# Patient Record
Sex: Female | Born: 1995 | Race: White | Hispanic: Yes | Marital: Married | Smoking: Never smoker
Health system: Southern US, Community
[De-identification: ages and names within clinical notes are randomized; demographics above are authoritative.]

## PROBLEM LIST (undated history)

## (undated) DIAGNOSIS — Z789 Other specified health status: Secondary | ICD-10-CM

## (undated) DIAGNOSIS — O139 Gestational [pregnancy-induced] hypertension without significant proteinuria, unspecified trimester: Secondary | ICD-10-CM

## (undated) HISTORY — PX: WISDOM TOOTH EXTRACTION: SHX21

## (undated) HISTORY — PX: NO PAST SURGERIES: SHX2092

---
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
## 2017-12-08 IMAGING — US US THYROID
1 series · 13 of 25 positions shown · non-contrast
Comparison: None.

CLINICAL DATA: Other.  Submandibular lump.

EXAM:
THYROID ULTRASOUND
TECHNIQUE: Ultrasound examination of the thyroid gland and adjacent soft
tissues was performed.

[Series 1: us thyroid · 0.04mm/px · 13 of 39 slices shown]
[im 1/39]
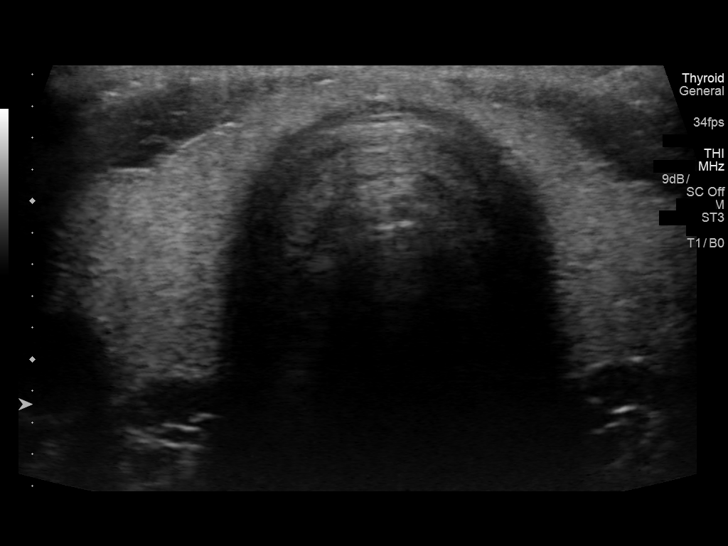
[im 4/39]
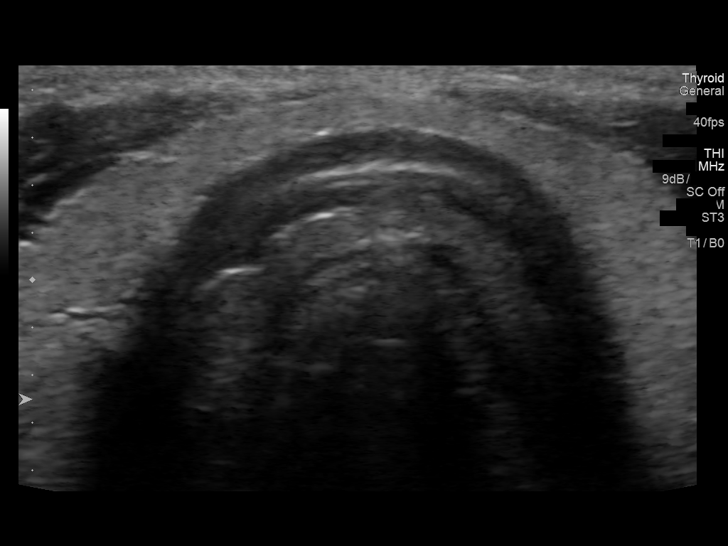
[im 7/39]
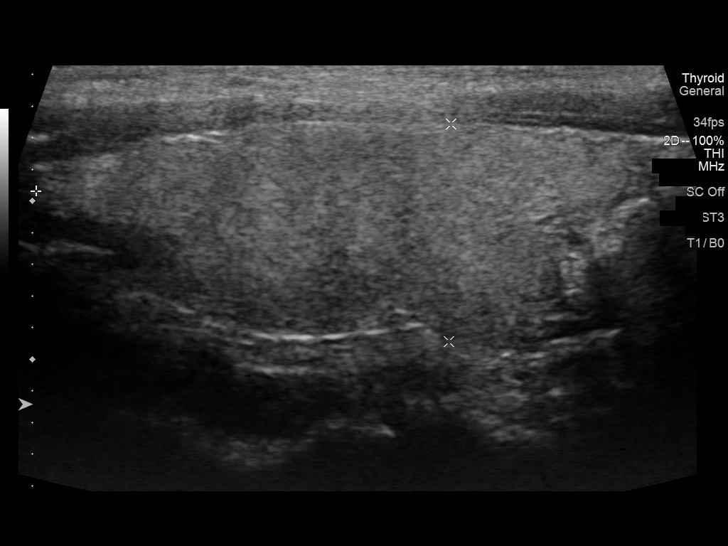
[im 10/39]
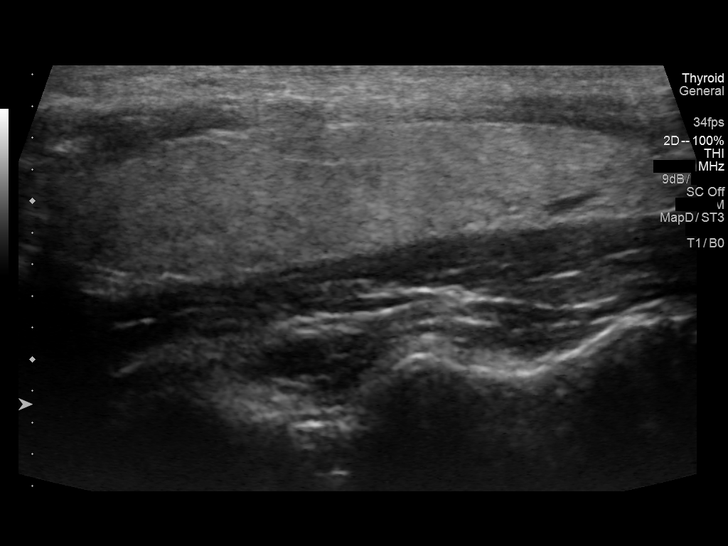
[im 13/39]
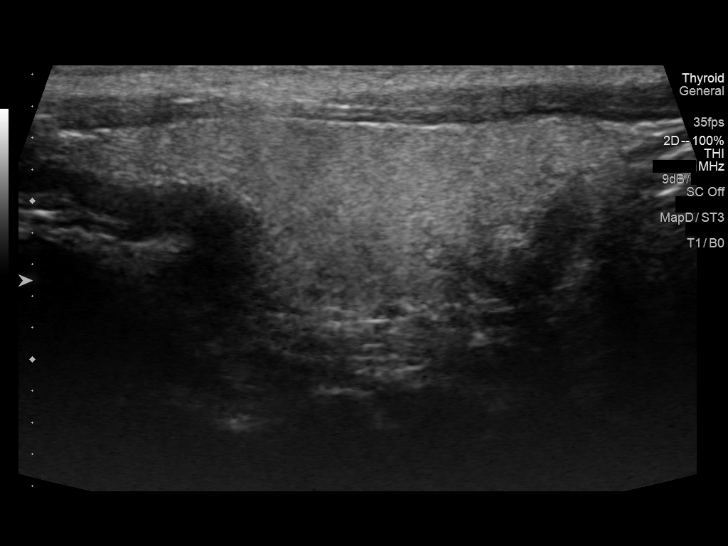
[im 16/39]
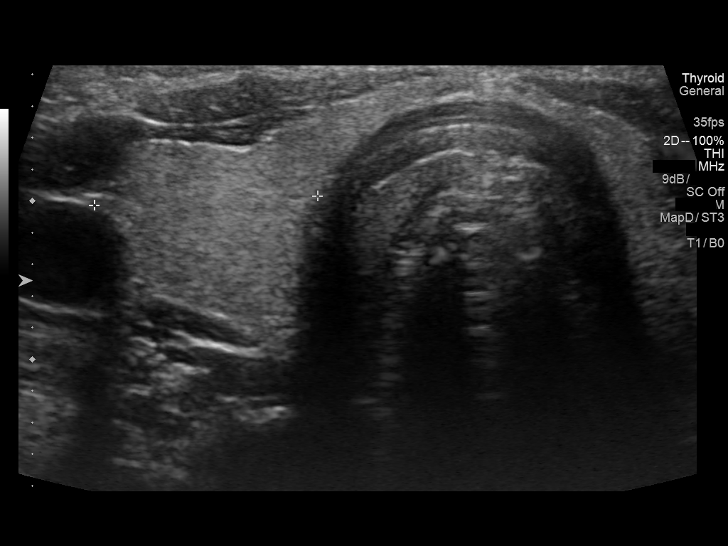
[im 20/39]
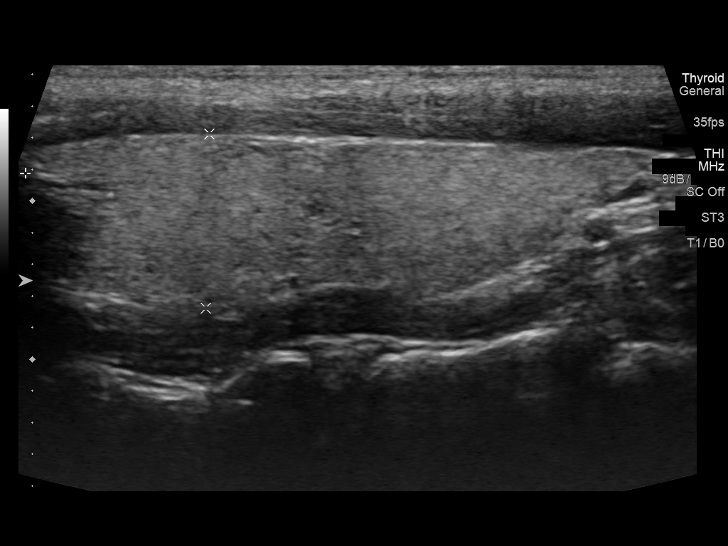
[im 23/39]
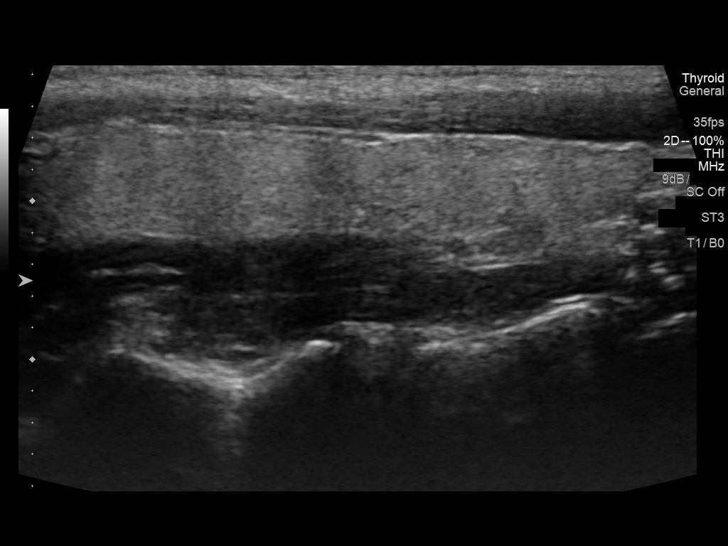
[im 26/39]
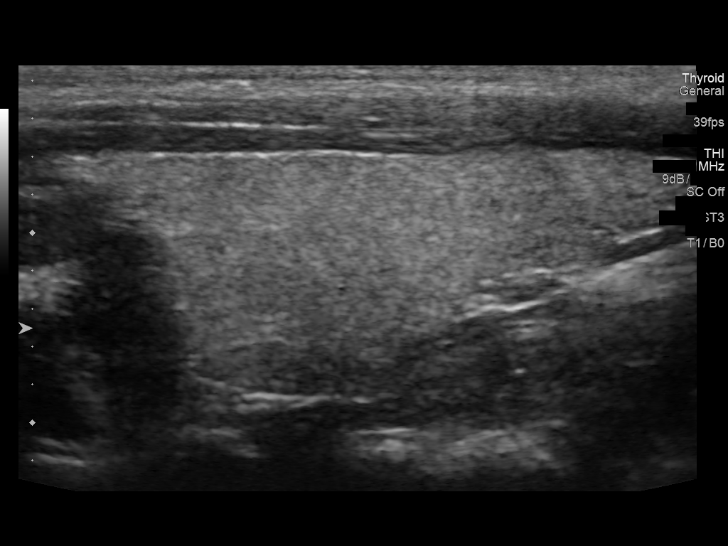
[im 29/39]
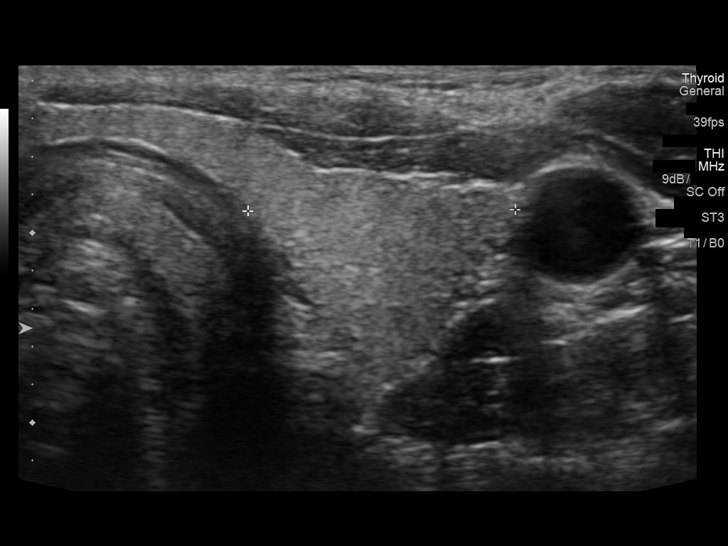
[im 32/39]
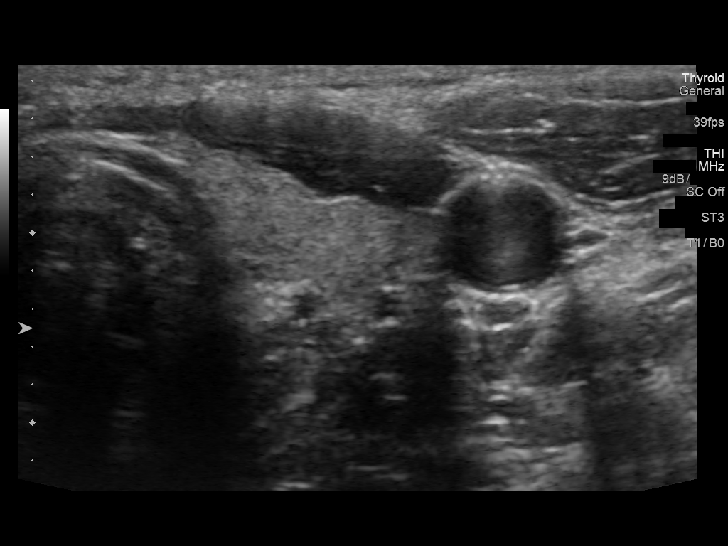
[im 35/39]
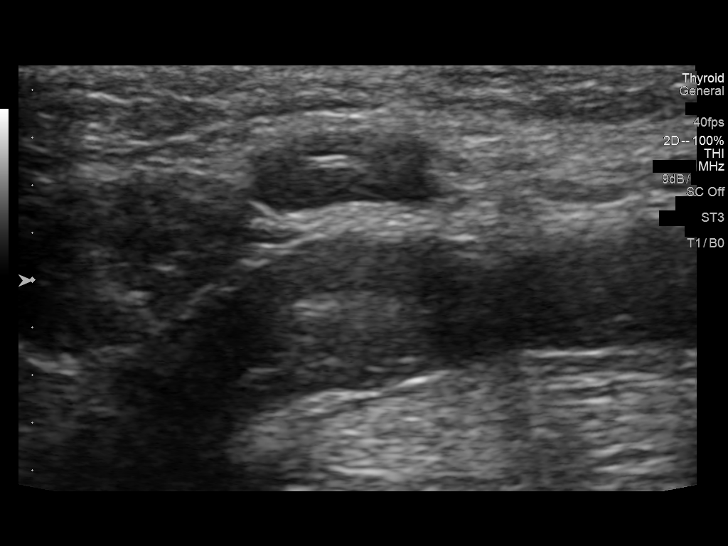
[im 39/39]
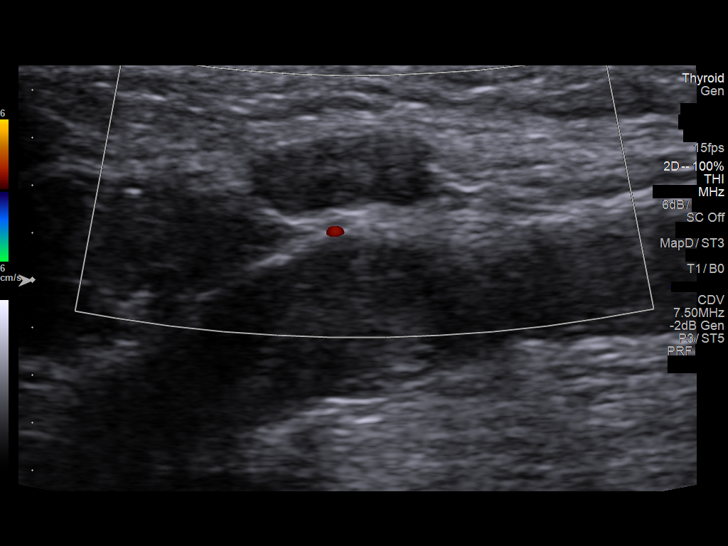

[13 of 25 positions shown; findings below may reference images not displayed]

FINDINGS: Parenchymal Echotexture: Normal

Isthmus: Normal in size measures 0.2 cm in diameter

Right lobe: Normal in size measuring 4.0 x 1.4 x 1.4 cm

Left lobe: Normal in size measuring 4.1 x 1.1 x 1.4 cm

_________________________________________________________

Estimated total number of nodules >/= 1 cm: 0

Number of spongiform nodules >/=  2 cm not described below (TR1): 0

Number of mixed cystic and solid nodules >/= 1.5 cm not described
below (TR2): 0

_________________________________________________________

No discrete nodules are seen within the thyroid gland.

There is a benign-appearing lymph node within the subcutaneous
tissues at the patient's palpable area of concern inferior to the
chin. The lymph node is not enlarged by size criteria, measuring
cm in diameter and maintains a benign fatty hilum (image 39).
IMPRESSION: 1. Normal thyroid ultrasound.
2. A solitary benign appearing non pathologically enlarged lymph
node correlates with the patient's palpable area of concern.

## 2018-03-05 ENCOUNTER — Encounter (HOSPITAL_COMMUNITY): Payer: Self-pay | Admitting: Emergency Medicine

## 2018-03-05 ENCOUNTER — Emergency Department (HOSPITAL_COMMUNITY)
Admission: EM | Admit: 2018-03-05 | Discharge: 2018-03-05 | Disposition: A | Discharge status: Home / Self Care | Payer: Self-pay | Attending: Emergency Medicine | Admitting: Emergency Medicine

## 2018-03-05 DIAGNOSIS — K529 Noninfective gastroenteritis and colitis, unspecified: Secondary | ICD-10-CM | POA: Insufficient documentation

## 2018-03-05 LAB — COMPREHENSIVE METABOLIC PANEL
ALT: 15 U/L (ref 0–44)
AST: 18 U/L (ref 15–41)
Albumin: 4.6 g/dL (ref 3.5–5.0)
Alkaline Phosphatase: 57 U/L (ref 38–126)
Anion gap: 8 (ref 5–15)
BUN: 12 mg/dL (ref 6–20)
CO2: 25 mmol/L (ref 22–32)
Calcium: 9 mg/dL (ref 8.9–10.3)
Chloride: 104 mmol/L (ref 98–111)
Creatinine, Ser: 0.58 mg/dL (ref 0.44–1.00)
GFR calc Af Amer: >60 mL/min (ref >60)
GFR calc non Af Amer: >60 mL/min (ref >60)
Glucose, Bld: 108 mg/dL — ABNORMAL HIGH (ref 70–99)
Potassium: 4 mmol/L (ref 3.5–5.1)
Sodium: 137 mmol/L (ref 135–145)
Total Bilirubin: 1.7 mg/dL — ABNORMAL HIGH (ref 0.3–1.2)
Total Protein: 7.3 g/dL (ref 6.5–8.1)

## 2018-03-05 LAB — CBC
HCT: 37.9 % (ref 36.0–46.0)
Hemoglobin: 12.8 g/dL (ref 12.0–15.0)
MCH: 29.7 pg (ref 26.0–34.0)
MCHC: 33.8 g/dL (ref 30.0–36.0)
MCV: 87.9 fL (ref 80.0–100.0)
Platelets: 211 10*3/uL (ref 150–400)
RBC: 4.31 MIL/uL (ref 3.87–5.11)
RDW: 12.3 % (ref 11.5–15.5)
WBC: 6.5 10*3/uL (ref 4.0–10.5)
nRBC: 0 % (ref 0.0–0.2)

## 2018-03-05 LAB — LIPASE, BLOOD: Lipase: 30 U/L (ref 11–51)

## 2018-03-05 LAB — I-STAT BETA HCG BLOOD, ED (MC, WL, AP ONLY): I-stat hCG, quantitative: <5 m[IU]/mL (ref <5)

## 2018-03-05 MED ORDER — ONDANSETRON 4 MG PO TBDP: 4.0000 mg | ORAL_TABLET | Freq: Three times a day (TID) | ORAL | 0 refills | Status: DC | PRN

## 2018-03-05 MED ORDER — DICYCLOMINE HCL 10 MG PO CAPS
10.0000 mg | ORAL_CAPSULE | Freq: Once | ORAL | Status: AC
  Administered 2018-03-05: 10 mg via ORAL
  Filled 2018-03-05: qty 1

## 2018-03-05 MED ORDER — ONDANSETRON 4 MG PO TBDP
4.0000 mg | ORAL_TABLET | Freq: Once | ORAL | Status: AC
  Administered 2018-03-05: 4 mg via ORAL
  Filled 2018-03-05: qty 1

## 2018-03-05 NOTE — ED Triage Notes (Signed)
Pt c/o abd pais with n/v/d that started around 4am this morning. reports other family members have been sick

## 2018-03-05 NOTE — ED Notes (Addendum)
Pt states that she has had 1 episode of emesis since arrival Pt requests meds for nausea Triage RN notified

## 2018-03-05 NOTE — ED Provider Notes (Signed)
[facility]-EMERGENCY DEPT Provider Note   CSN: [id number] Arrival date & time: 03/05/18  1758     History   Chief Complaint Chief Complaint  Patient presents with  . Emesis  . Diarrhea  . Abdominal Pain    HPI Tonya Mclean is a 23 y.o. female.  23 year old female presents to the emergency department for evaluation of vomiting and diarrhea.  Symptoms began yesterday.  She has had approximately 10 episodes of vomiting as well as 4 episodes of watery, nonbloody diarrhea.  Has had improvement in her nausea since taking Zofran prior to arrival.  Reports some sharp abdominal discomfort which is intermittent, located in her epigastrium.  Symptoms began after eating at a restaurant Sunday evening.  She presents with her 2 siblings who are experiencing similar symptoms; states her friends are also experiencing N/V/D as well.  No hx of abdominal surgeries.  The history is provided by the patient. No language interpreter was used.  Emesis  Associated symptoms: abdominal pain and diarrhea   Diarrhea  Associated symptoms: abdominal pain and vomiting   Abdominal Pain  Associated symptoms: diarrhea and vomiting     History reviewed. No pertinent past medical history.  There are no active problems to display for this patient.   History reviewed. No pertinent surgical history.   OB History   No obstetric history on file.      Home Medications    Prior to Admission medications   Medication Sig Start Date End Date Taking? Authorizing Provider  ibuprofen (ADVIL,MOTRIN) 200 MG tablet Take 400 mg by mouth daily as needed (pain).   Yes [provider]  ondansetron (ZOFRAN ODT) 4 MG disintegrating tablet Take 1 tablet (4 mg total) by mouth every 8 (eight) hours as needed for nausea or vomiting. 03/05/18   Antony MaduraHumes, Yama Nielson, PA-C    Family History No family history on file.  Social History Social History   Tobacco Use  . Smoking status: Never Smoker  .  Smokeless tobacco: Never Used  Substance Use Topics  . Alcohol use: Not on file  . Drug use: Not on file     Allergies   Patient has no known allergies.   Review of Systems Review of Systems  Gastrointestinal: Positive for abdominal pain, diarrhea and vomiting.  Ten systems reviewed and are negative for acute change, except as noted in the HPI.    Physical Exam Updated Vital Signs BP 115/60 (BP Location: Left Arm)   Pulse 98   Temp 99.3 F (37.4 C) (Oral)   Resp 14   Ht 5' 3.5" (1.613 m)   Wt 55.9 kg   LMP 02/25/2018   SpO2 98%   BMI 21.48 kg/m   Physical Exam Vitals signs and nursing note reviewed.  Constitutional:      General: She is not in acute distress.    Appearance: She is well-developed. She is not diaphoretic.     Comments: Nontoxic appearing and in NAD  HENT:     Head: Normocephalic and atraumatic.     Mouth/Throat:     Comments: Moist mm Eyes:     General: No scleral icterus.    Conjunctiva/sclera: Conjunctivae normal.  Neck:     Musculoskeletal: Normal range of motion.  Cardiovascular:     Rate and Rhythm: Normal rate and regular rhythm.     Pulses: Normal pulses.  Pulmonary:     Effort: Pulmonary effort is normal. No respiratory distress.     Breath sounds: No  wheezing, rhonchi or rales.     Comments: Respirations even and unlabored Abdominal:     General: Abdomen is flat.     Palpations: Abdomen is soft. There is no mass.     Tenderness: There is abdominal tenderness (epigastric, mild). There is no guarding.     Hernia: No hernia is present.  Musculoskeletal: Normal range of motion.  Skin:    General: Skin is warm and dry.     Coloration: Skin is not pale.     Findings: No erythema or rash.  Neurological:     Mental Status: She is alert and oriented to person, place, and time.     Coordination: Coordination normal.  Psychiatric:        Behavior: Behavior normal.      ED Treatments / Results  Labs (all labs ordered are listed,  but only abnormal results are displayed) Labs Reviewed  COMPREHENSIVE METABOLIC PANEL - Abnormal; Notable for the following components:      Result Value   Glucose, Bld 108 (*)    Total Bilirubin 1.7 (*)    All other components within normal limits  LIPASE, BLOOD  CBC  I-STAT BETA HCG BLOOD, ED ([facility], [facility], [facility] ONLY)    EKG None  Radiology No results found.  Procedures Procedures (including critical care time)  Medications Ordered in ED Medications  ondansetron (ZOFRAN-ODT) disintegrating tablet 4 mg (4 mg Oral Given 03/05/18 2134)    10:44 PM Patient tolerating PO fluids without emesis.   Initial Impression / Assessment and Plan / ED Course  I have reviewed the triage vital signs and the nursing notes.  Pertinent labs & imaging results that were available during my care of the patient were reviewed by me and considered in my medical decision making (see chart for details).     Patient with symptoms consistent with gastroenteritis.  Vitals are stable, no fever.  No clinical signs of dehydration, moist mm.  Tolerating PO fluids after Zofran given in triage.  Abdomen soft without masses or peritoneal signs.  Patient presenting with 2 siblings, both of whom are suffering from similar symptoms. Doubt emergent etiology of symptoms, likely viral vs food borne.  Will continue with outpatient management with Zofran.  Encouraged PCP follow up.  Return precautions discussed and provided. Patient discharged in stable condition with no unaddressed concerns.   Final Clinical Impressions(s) / ED Diagnoses   Final diagnoses:  Gastroenteritis    ED Discharge Orders         Ordered    ondansetron (ZOFRAN ODT) 4 MG disintegrating tablet  Every 8 hours PRN     03/05/18 2244           Antony Madura, PA-C 03/05/18 2247    Shaune Pollack, MD 03/06/18 [phone]

## 2018-03-05 NOTE — ED Notes (Signed)
Pt is alert and orinted x 4  And is verbally responsive. Pt offered fluids and tolerated well.

## 2018-03-05 NOTE — Discharge Instructions (Signed)
Avoid fried foods, fatty foods, greasy foods, and milk products until symptoms resolve. Be sure you drink plenty of clear liquids. We recommend the use of Zofran as prescribed for nausea/vomiting. Follow-up with your primary care doctor to ensure resolution of symptoms. °

## 2019-02-21 NOTE — L&D Delivery Note (Signed)
Delivery Note Patient pushed well for approximately 1 hour.  At 10:22 AM a viable female was delivered via Vaginal, Spontaneous (Presentation: Left Occiput Anterior).  APGAR: 9, 9; weight 3715 gm *8lb 3oz)  .   Placenta status: Spontaneous, Intact.  Cord: 3 vessels with the following complications: None.  Cord pH: n/a   Anesthesia: Epidural Episiotomy: None Lacerations: 2nd degree;Labial--left;Sulcus--right Suture Repair: 2.0 3.0 vicryl Est. Blood Loss (mL):   Mom to postpartum.  Baby to Couplet care / Skin to Skin.  Tonya Mclean GEFFEL Tonya Mclean 11/10/2019, 11:28 AM

## 2019-02-25 ENCOUNTER — Encounter: Payer: Self-pay | Admitting: Family Medicine

## 2019-02-25 ENCOUNTER — Other Ambulatory Visit: Payer: Self-pay

## 2019-02-25 ENCOUNTER — Ambulatory Visit: Payer: Managed Care, Other (non HMO) | Admitting: Family Medicine

## 2019-02-25 VITALS — BP 105/66 | HR 73 | Temp 98.3°F | Ht 63.5 in | Wt 139.2 lb

## 2019-02-25 DIAGNOSIS — N3090 Cystitis, unspecified without hematuria: Secondary | ICD-10-CM

## 2019-02-25 DIAGNOSIS — N390 Urinary tract infection, site not specified: Secondary | ICD-10-CM

## 2019-02-25 DIAGNOSIS — Z23 Encounter for immunization: Secondary | ICD-10-CM

## 2019-02-25 LAB — POCT URINALYSIS DIP (MANUAL ENTRY)
Bilirubin, UA: NEGATIVE
Glucose, UA: NEGATIVE mg/dL
Ketones, POC UA: NEGATIVE mg/dL
Nitrite, UA: NEGATIVE
Protein Ur, POC: NEGATIVE mg/dL
Spec Grav, UA: 1.02 (ref 1.010–1.025)
Urobilinogen, UA: 0.2 E.U./dL
pH, UA: 7 (ref 5.0–8.0)

## 2019-02-25 LAB — POC MICROSCOPIC URINALYSIS (UMFC): Mucus: ABSENT

## 2019-02-25 MED ORDER — CEPHALEXIN 500 MG PO CAPS: 500.0000 mg | ORAL_CAPSULE | Freq: Two times a day (BID) | ORAL | 0 refills | Status: DC

## 2019-02-25 NOTE — Patient Instructions (Addendum)
Drink plenty of fluids  Take cephalexin 500 mg 1 twice daily  Return if worse  You can take some over-the-counter Azo if needed for pain   Urinary Tract Infection, Adult A urinary tract infection (UTI) is an infection of any part of the urinary tract. The urinary tract includes:  The kidneys.  The ureters.  The bladder.  The urethra. These organs make, store, and get rid of pee (urine) in the body. What are the causes? This is caused by germs (bacteria) in your genital area. These germs grow and cause swelling (inflammation) of your urinary tract. What increases the risk? You are more likely to develop this condition if:  You have a small, thin tube (catheter) to drain pee.  You cannot control when you pee or poop (incontinence).  You are female, and: ? You use these methods to prevent pregnancy:  A medicine that kills sperm (spermicide).  A device that blocks sperm (diaphragm). ? You have low levels of a female hormone (estrogen). ? You are pregnant.  You have genes that add to your risk.  You are sexually active.  You take antibiotic medicines.  You have trouble peeing because of: ? A prostate that is bigger than normal, if you are female. ? A blockage in the part of your body that drains pee from the bladder (urethra). ? A kidney stone. ? A nerve condition that affects your bladder (neurogenic bladder). ? Not getting enough to drink. ? Not peeing often enough.  You have other conditions, such as: ? Diabetes. ? A weak disease-fighting system (immune system). ? Sickle cell disease. ? Gout. ? Injury of the spine. What are the signs or symptoms? Symptoms of this condition include:  Needing to pee right away (urgently).  Peeing often.  Peeing small amounts often.  Pain or burning when peeing.  Blood in the pee.  Pee that smells bad or not like normal.  Trouble peeing.  Pee that is cloudy.  Fluid coming from the vagina, if you are  female.  Pain in the belly or lower back. Other symptoms include:  Throwing up (vomiting).  No urge to eat.  Feeling mixed up (confused).  Being tired and grouchy (irritable).  A fever.  Watery poop (diarrhea). How is this treated? This condition may be treated with:  Antibiotic medicine.  Other medicines.  Drinking enough water. Follow these instructions at home:  Medicines  Take over-the-counter and prescription medicines only as told by your doctor.  If you were prescribed an antibiotic medicine, take it as told by your doctor. Do not stop taking it even if you start to feel better. General instructions  Make sure you: ? Pee until your bladder is empty. ? Do not hold pee for a long time. ? Empty your bladder after sex. ? Wipe from front to back after pooping if you are a female. Use each tissue one time when you wipe.  Drink enough fluid to keep your pee pale yellow.  Keep all follow-up visits as told by your doctor. This is important. Contact a doctor if:  You do not get better after 1-2 days.  Your symptoms go away and then come back. Get help right away if:  You have very bad back pain.  You have very bad pain in your lower belly.  You have a fever.  You are sick to your stomach (nauseous).  You are throwing up. Summary  A urinary tract infection (UTI) is an infection of any part of  the urinary tract.  This condition is caused by germs in your genital area.  There are many risk factors for a UTI. These include having a small, thin tube to drain pee and not being able to control when you pee or poop.  Treatment includes antibiotic medicines for germs.  Drink enough fluid to keep your pee pale yellow. This information is not intended to replace advice given to you by your health care provider. Make sure you discuss any questions you have with your health care provider. Document Revised: 01/24/2018 Document Reviewed: 08/16/2017 Elsevier  Patient Education  [employer].   If you have lab work done today you will be contacted with your lab results within the next 2 weeks.  If you have not heard from [place] then please contact us. The fastest way to get your results is to register for My Chart.   IF you received an x-ray today, you will receive an invoice from [facility]. Please contact [facility] at [phone] with questions or concerns regarding your invoice.   IF you received labwork today, you will receive an invoice from [place] [facility]. Please contact [facility] at [phone] with questions or concerns regarding your invoice.   Our billing staff will not be able to assist you with questions regarding bills from these companies.  You will be contacted with the lab results as soon as they are available. The fastest way to get your results is to activate your My Chart account. Instructions are located on the last page of this paperwork. If you have not heard from [place] regarding the results in 2 weeks, please contact this office.

## 2019-02-25 NOTE — Progress Notes (Signed)
Patient ID: ESTEE YOHE, female    DOB: [birth year]  Age: 24 y.o. MRN: [id number]  Chief Complaint  Patient presents with  . Urinary Tract Infection    buring while urinateing. started on 02/22/2119. no discharge, but their is an odor per pt.    Subjective:  Patient has been having dysuria for about 3 days.  She had had intercourse Saturday before started having the burning.  This is the first urinary tract infection she has had.  She works as a Lawyer.  Is sexually involved and not using protection they are happy if they become pregnant.  Current allergies, medications, problem list, past/family and social histories reviewed.  Objective:  BP 105/66   Pulse 73   Temp 98.3 F (36.8 C) (Temporal)   Ht 5' 3.5" (1.613 m)   Wt 139 lb 3.2 oz (63.1 kg)   SpO2 96%   BMI 24.27 kg/m   No CVA tenderness.  Abdomen soft without mass or tenderness.  Urinalysis shows trace leukocytes on the dip.  However symptoms are very clear.  Assessment & Plan:   Assessment: 1. Urinary tract infection without hematuria, site unspecified   2. Need for prophylactic vaccination with combined diphtheria-tetanus-pertussis (DTP) vaccine       Plan: Treat for cystitis.  Orders Placed This Encounter  Procedures  . TDAP VACCINE  . POCT urinalysis dipstick    No orders of the defined types were placed in this encounter.        Patient Instructions       If you have lab work done today you will be contacted with your lab results within the next 2 weeks.  If you have not heard from [place] then please contact us. The fastest way to get your results is to register for My Chart.   IF you received an x-ray today, you will receive an invoice from [facility]. Please contact [facility] at [phone] with questions or concerns regarding your invoice.   IF you received labwork today, you will receive an invoice from [place]. Please contact [facility] at [phone] with questions or  concerns regarding your invoice.   Our billing staff will not be able to assist you with questions regarding bills from these companies.  You will be contacted with the lab results as soon as they are available. The fastest way to get your results is to activate your My Chart account. Instructions are located on the last page of this paperwork. If you have not heard from [place] regarding the results in 2 weeks, please contact this office.        No follow-ups on file.   Janace Hoard, MD 02/25/2019

## 2019-02-27 LAB — URINE CULTURE

## 2019-02-27 NOTE — Progress Notes (Signed)
Urine grew E. coli that is sensitive to everything except ampicillin.  Should respond to the cephalexin.

## 2019-04-28 LAB — OB RESULTS CONSOLE RPR: RPR: NONREACTIVE

## 2019-04-28 LAB — OB RESULTS CONSOLE HIV ANTIBODY (ROUTINE TESTING): HIV: NONREACTIVE

## 2019-04-28 LAB — OB RESULTS CONSOLE RUBELLA ANTIBODY, IGM: Rubella: IMMUNE

## 2019-04-28 LAB — OB RESULTS CONSOLE HEPATITIS B SURFACE ANTIGEN: Hepatitis B Surface Ag: NEGATIVE

## 2019-10-23 LAB — OB RESULTS CONSOLE GC/CHLAMYDIA
Chlamydia: NEGATIVE
Gonorrhea: NEGATIVE

## 2019-10-23 LAB — OB RESULTS CONSOLE GBS: GBS: POSITIVE

## 2019-11-09 ENCOUNTER — Inpatient Hospital Stay (HOSPITAL_COMMUNITY)
Admission: AD | Admit: 2019-11-09 | Discharge: 2019-11-12 | DRG: 807 | Disposition: A | Discharge status: Home / Self Care | Payer: Managed Care, Other (non HMO) | Attending: Obstetrics | Admitting: Obstetrics

## 2019-11-09 ENCOUNTER — Other Ambulatory Visit: Payer: Self-pay

## 2019-11-09 DIAGNOSIS — O99214 Obesity complicating childbirth: Secondary | ICD-10-CM | POA: Diagnosis present

## 2019-11-09 DIAGNOSIS — O99824 Streptococcus B carrier state complicating childbirth: Secondary | ICD-10-CM | POA: Diagnosis present

## 2019-11-09 DIAGNOSIS — O1404 Mild to moderate pre-eclampsia, complicating childbirth: Principal | ICD-10-CM | POA: Diagnosis present

## 2019-11-09 DIAGNOSIS — E669 Obesity, unspecified: Secondary | ICD-10-CM | POA: Diagnosis present

## 2019-11-09 DIAGNOSIS — Z20822 Contact with and (suspected) exposure to covid-19: Secondary | ICD-10-CM | POA: Diagnosis present

## 2019-11-09 DIAGNOSIS — Z3A39 39 weeks gestation of pregnancy: Secondary | ICD-10-CM

## 2019-11-09 DIAGNOSIS — O1490 Unspecified pre-eclampsia, unspecified trimester: Secondary | ICD-10-CM | POA: Diagnosis present

## 2019-11-09 HISTORY — DX: Other specified health status: Z78.9

## 2019-11-10 ENCOUNTER — Inpatient Hospital Stay (HOSPITAL_COMMUNITY): Payer: Managed Care, Other (non HMO) | Admitting: Anesthesiology

## 2019-11-10 ENCOUNTER — Encounter (HOSPITAL_COMMUNITY): Payer: Self-pay

## 2019-11-10 DIAGNOSIS — O26893 Other specified pregnancy related conditions, third trimester: Secondary | ICD-10-CM | POA: Diagnosis present

## 2019-11-10 DIAGNOSIS — Z20822 Contact with and (suspected) exposure to covid-19: Secondary | ICD-10-CM | POA: Diagnosis present

## 2019-11-10 DIAGNOSIS — Z3A39 39 weeks gestation of pregnancy: Secondary | ICD-10-CM | POA: Diagnosis not present

## 2019-11-10 DIAGNOSIS — O99214 Obesity complicating childbirth: Secondary | ICD-10-CM | POA: Diagnosis present

## 2019-11-10 DIAGNOSIS — O1490 Unspecified pre-eclampsia, unspecified trimester: Secondary | ICD-10-CM | POA: Diagnosis present

## 2019-11-10 DIAGNOSIS — O1404 Mild to moderate pre-eclampsia, complicating childbirth: Secondary | ICD-10-CM | POA: Diagnosis present

## 2019-11-10 DIAGNOSIS — O99824 Streptococcus B carrier state complicating childbirth: Secondary | ICD-10-CM | POA: Diagnosis present

## 2019-11-10 DIAGNOSIS — E669 Obesity, unspecified: Secondary | ICD-10-CM | POA: Diagnosis present

## 2019-11-10 LAB — CBC
HCT: 37.9 % (ref 36.0–46.0)
HCT: 35.1 % — ABNORMAL LOW (ref 36.0–46.0)
Hemoglobin: 12.8 g/dL (ref 12.0–15.0)
Hemoglobin: 11.9 g/dL — ABNORMAL LOW (ref 12.0–15.0)
MCH: 29.8 pg (ref 26.0–34.0)
MCH: 30 pg (ref 26.0–34.0)
MCHC: 33.8 g/dL (ref 30.0–36.0)
MCHC: 33.9 g/dL (ref 30.0–36.0)
MCV: 88.3 fL (ref 80.0–100.0)
MCV: 88.4 fL (ref 80.0–100.0)
Platelets: 256 10*3/uL (ref 150–400)
Platelets: 225 10*3/uL (ref 150–400)
RBC: 4.29 MIL/uL (ref 3.87–5.11)
RBC: 3.97 MIL/uL (ref 3.87–5.11)
RDW: 13.1 % (ref 11.5–15.5)
RDW: 13.1 % (ref 11.5–15.5)
WBC: 11.7 10*3/uL — ABNORMAL HIGH (ref 4.0–10.5)
WBC: 15.3 10*3/uL — ABNORMAL HIGH (ref 4.0–10.5)
nRBC: 0 % (ref 0.0–0.2)
nRBC: 0 % (ref 0.0–0.2)

## 2019-11-10 LAB — COMPREHENSIVE METABOLIC PANEL
ALT: 15 U/L (ref 0–44)
AST: 20 U/L (ref 15–41)
Albumin: 3.1 g/dL — ABNORMAL LOW (ref 3.5–5.0)
Alkaline Phosphatase: 236 U/L — ABNORMAL HIGH (ref 38–126)
Anion gap: 13 (ref 5–15)
BUN: 8 mg/dL (ref 6–20)
CO2: 17 mmol/L — ABNORMAL LOW (ref 22–32)
Calcium: 9.9 mg/dL (ref 8.9–10.3)
Chloride: 105 mmol/L (ref 98–111)
Creatinine, Ser: 0.55 mg/dL (ref 0.44–1.00)
GFR calc Af Amer: >60 mL/min (ref >60)
GFR calc non Af Amer: >60 mL/min (ref >60)
Glucose, Bld: 109 mg/dL — ABNORMAL HIGH (ref 70–99)
Potassium: 3.8 mmol/L (ref 3.5–5.1)
Sodium: 135 mmol/L (ref 135–145)
Total Bilirubin: 0.3 mg/dL (ref 0.3–1.2)
Total Protein: 6.5 g/dL (ref 6.5–8.1)

## 2019-11-10 LAB — TYPE AND SCREEN
ABO/RH(D): O POS
Antibody Screen: NEGATIVE

## 2019-11-10 LAB — SARS CORONAVIRUS 2 BY RT PCR (HOSPITAL ORDER, PERFORMED IN ~~LOC~~ HOSPITAL LAB): SARS Coronavirus 2: NEGATIVE

## 2019-11-10 LAB — PROTEIN / CREATININE RATIO, URINE
Creatinine, Urine: 85.78 mg/dL
Protein Creatinine Ratio: 0.69 mg/mg{Cre} — ABNORMAL HIGH (ref 0.00–0.15)
Total Protein, Urine: 59 mg/dL

## 2019-11-10 LAB — RPR: RPR Ser Ql: NONREACTIVE

## 2019-11-10 MED ORDER — OXYCODONE-ACETAMINOPHEN 5-325 MG PO TABS: 1.0000 | ORAL_TABLET | ORAL | Status: DC | PRN

## 2019-11-10 MED ORDER — ACETAMINOPHEN 325 MG PO TABS: 650.0000 mg | ORAL_TABLET | ORAL | Status: DC | PRN

## 2019-11-10 MED ORDER — OXYTOCIN BOLUS FROM INFUSION
333.0000 mL | Freq: Once | INTRAVENOUS | Status: AC
  Administered 2019-11-10: 333 mL via INTRAVENOUS

## 2019-11-10 MED ORDER — COCONUT OIL OIL: 1.0000 "application " | TOPICAL_OIL | Status: DC | PRN

## 2019-11-10 MED ORDER — SIMETHICONE 80 MG PO CHEW: 80.0000 mg | CHEWABLE_TABLET | ORAL | Status: DC | PRN

## 2019-11-10 MED ORDER — LACTATED RINGERS IV SOLN: 500.0000 mL | INTRAVENOUS | Status: DC | PRN

## 2019-11-10 MED ORDER — BENZOCAINE-MENTHOL 20-0.5 % EX AERO
1.0000 "application " | INHALATION_SPRAY | CUTANEOUS | Status: DC | PRN
  Administered 2019-11-10: 1 via TOPICAL
  Filled 2019-11-10: qty 56

## 2019-11-10 MED ORDER — SENNOSIDES-DOCUSATE SODIUM 8.6-50 MG PO TABS
2.0000 | ORAL_TABLET | ORAL | Status: DC
  Administered 2019-11-10 – 2019-11-11 (×2): 2 via ORAL
  Filled 2019-11-10 (×2): qty 2

## 2019-11-10 MED ORDER — OXYTOCIN-SODIUM CHLORIDE 30-0.9 UT/500ML-% IV SOLN
2.5000 [IU]/h | INTRAVENOUS | Status: DC
  Filled 2019-11-10: qty 500

## 2019-11-10 MED ORDER — LACTATED RINGERS IV SOLN: INTRAVENOUS | Status: DC

## 2019-11-10 MED ORDER — DIPHENHYDRAMINE HCL 25 MG PO CAPS: 25.0000 mg | ORAL_CAPSULE | Freq: Four times a day (QID) | ORAL | Status: DC | PRN

## 2019-11-10 MED ORDER — SODIUM CHLORIDE (PF) 0.9 % IJ SOLN
INTRAMUSCULAR | Status: DC | PRN
Start: 2019-11-10 — End: 2019-11-10
  Administered 2019-11-10: 12 mL/h via EPIDURAL

## 2019-11-10 MED ORDER — TERBUTALINE SULFATE 1 MG/ML IJ SOLN: 0.2500 mg | Freq: Once | INTRAMUSCULAR | Status: DC | PRN

## 2019-11-10 MED ORDER — TETANUS-DIPHTH-ACELL PERTUSSIS 5-2.5-18.5 LF-MCG/0.5 IM SUSP: 0.5000 mL | Freq: Once | INTRAMUSCULAR | Status: DC

## 2019-11-10 MED ORDER — PENICILLIN G POT IN DEXTROSE 60000 UNIT/ML IV SOLN
3.0000 10*6.[IU] | INTRAVENOUS | Status: DC
  Administered 2019-11-10: 3 10*6.[IU] via INTRAVENOUS
  Filled 2019-11-10: qty 50

## 2019-11-10 MED ORDER — ONDANSETRON HCL 4 MG/2ML IJ SOLN: 4.0000 mg | Freq: Four times a day (QID) | INTRAMUSCULAR | Status: DC | PRN

## 2019-11-10 MED ORDER — DIPHENHYDRAMINE HCL 50 MG/ML IJ SOLN: 12.5000 mg | INTRAMUSCULAR | Status: DC | PRN

## 2019-11-10 MED ORDER — OXYCODONE-ACETAMINOPHEN 5-325 MG PO TABS: 2.0000 | ORAL_TABLET | ORAL | Status: DC | PRN

## 2019-11-10 MED ORDER — LIDOCAINE HCL (PF) 1 % IJ SOLN: 30.0000 mL | INTRAMUSCULAR | Status: DC | PRN

## 2019-11-10 MED ORDER — MISOPROSTOL 100 MCG PO TABS: 25.0000 ug | ORAL_TABLET | ORAL | Status: DC

## 2019-11-10 MED ORDER — LIDOCAINE HCL (PF) 1 % IJ SOLN
INTRAMUSCULAR | Status: DC | PRN
  Administered 2019-11-10 (×2): 4 mL via EPIDURAL

## 2019-11-10 MED ORDER — WITCH HAZEL-GLYCERIN EX PADS: 1.0000 "application " | MEDICATED_PAD | CUTANEOUS | Status: DC | PRN

## 2019-11-10 MED ORDER — PENICILLIN G POT IN DEXTROSE 60000 UNIT/ML IV SOLN: 3.0000 10*6.[IU] | INTRAVENOUS | Status: DC

## 2019-11-10 MED ORDER — ONDANSETRON HCL 4 MG PO TABS: 4.0000 mg | ORAL_TABLET | ORAL | Status: DC | PRN

## 2019-11-10 MED ORDER — EPHEDRINE 5 MG/ML INJ: 10.0000 mg | INTRAVENOUS | Status: DC | PRN

## 2019-11-10 MED ORDER — SODIUM CHLORIDE 0.9 % IV SOLN
5.0000 10*6.[IU] | Freq: Once | INTRAVENOUS | Status: AC
  Administered 2019-11-10: 5 10*6.[IU] via INTRAVENOUS
  Filled 2019-11-10: qty 5

## 2019-11-10 MED ORDER — PRENATAL MULTIVITAMIN CH
1.0000 | ORAL_TABLET | Freq: Every day | ORAL | Status: DC
  Administered 2019-11-10 – 2019-11-12 (×3): 1 via ORAL
  Filled 2019-11-10 (×2): qty 1

## 2019-11-10 MED ORDER — FENTANYL-BUPIVACAINE-NACL 0.5-0.125-0.9 MG/250ML-% EP SOLN
12.0000 mL/h | EPIDURAL | Status: DC | PRN
  Filled 2019-11-10: qty 250

## 2019-11-10 MED ORDER — PHENYLEPHRINE 40 MCG/ML (10ML) SYRINGE FOR IV PUSH (FOR BLOOD PRESSURE SUPPORT): 80.0000 ug | PREFILLED_SYRINGE | INTRAVENOUS | Status: DC | PRN

## 2019-11-10 MED ORDER — DIBUCAINE (PERIANAL) 1 % EX OINT: 1.0000 "application " | TOPICAL_OINTMENT | CUTANEOUS | Status: DC | PRN

## 2019-11-10 MED ORDER — ONDANSETRON HCL 4 MG/2ML IJ SOLN: 4.0000 mg | INTRAMUSCULAR | Status: DC | PRN

## 2019-11-10 MED ORDER — IBUPROFEN 600 MG PO TABS
600.0000 mg | ORAL_TABLET | Freq: Four times a day (QID) | ORAL | Status: DC
  Administered 2019-11-10 – 2019-11-12 (×9): 600 mg via ORAL
  Filled 2019-11-10 (×8): qty 1

## 2019-11-10 MED ORDER — SOD CITRATE-CITRIC ACID 500-334 MG/5ML PO SOLN: 30.0000 mL | ORAL | Status: DC | PRN

## 2019-11-10 MED ORDER — OXYCODONE HCL 5 MG PO TABS: 5.0000 mg | ORAL_TABLET | ORAL | Status: DC | PRN

## 2019-11-10 MED ORDER — FENTANYL CITRATE (PF) 100 MCG/2ML IJ SOLN
100.0000 ug | Freq: Once | INTRAMUSCULAR | Status: AC
  Administered 2019-11-10: 100 ug via INTRAVENOUS
  Filled 2019-11-10: qty 2

## 2019-11-10 MED ORDER — LACTATED RINGERS IV SOLN: 500.0000 mL | Freq: Once | INTRAVENOUS | Status: DC

## 2019-11-10 MED ORDER — OXYCODONE HCL 5 MG PO TABS: 10.0000 mg | ORAL_TABLET | ORAL | Status: DC | PRN

## 2019-11-10 NOTE — Lactation Note (Signed)
This note was copied from a baby's chart. Lactation Consultation Note  Patient Name: Tonya Mclean LSLHT'D Date: 11/10/2019   Baby Tonya Genesis now 59 hours old. Mom reports she recently tried to breastfeed her for the second time and she would not latch so they gave her formula. Mom reports that she threw it up.  She is STS with mom.  Not cuing.  Asked if we could attempt to latch.  Mom agreed .  Went to move her to latch her and she was spitty so did not attempt. She had close to 15 ml of formula.  Urged parents to give less formula next time if they chose to give it again.   Left STS.    Mom reports no breastfeeding education.  Mom reports her breasts got bigger in pregnancy, no soreness or color changes that she noticed.  Mom reports she would like to breastfeed a year.  Mom reports she will go back to work and has a DEBP for home use.  Urged mom to feed her on cue and 8-12 times day.    Reviewed Understanding Mother and Baby.  Reviewed and gave [facility] breastfeeding resource handout.  Urged STS. Urged to call lactation as need.     Maternal Data    Feeding Feeding Type: Breast Fed  LATCH Score Latch: Too sleepy or reluctant, no latch achieved, no sucking elicited.  Audible Swallowing: None  Type of Nipple: Everted at rest and after stimulation  Comfort (Breast/Nipple): Soft / non-tender  Hold (Positioning): Assistance needed to correctly position infant at breast and maintain latch.  LATCH Score: 5  Interventions Interventions: Breast feeding basics reviewed;Assisted with latch;Skin to skin;Breast massage;Hand express;Support pillows;Position options  Lactation Tools Discussed/Used WIC Program: No   Consult Status      Neomia Dear 11/10/2019, 8:33 PM

## 2019-11-10 NOTE — H&P (Signed)
24 y.o. G1P0 @ [redacted]w[redacted]d presents with contractions.  In MAU, she was found to be in early labor.  She was also noted to have new onset preeclamsia without severe features.  Here labs were normal, with the exception of an elevated up:c at 0.69.  She was admitted to L&D.  She has progressed to 7cm without augmentation.  Otherwise has good fetal movement and no bleeding.  Her pregnancy has otherwise been uncomplicated.  Past Medical History:  Diagnosis Date  . Medical history non-contributory     Past Surgical History:  Procedure Laterality Date  . NO PAST SURGERIES      OB History  Gravida Para Term Preterm AB Living  1            SAB TAB Ectopic Multiple Live Births               # Outcome Date GA Lbr Len/2nd Weight Sex Delivery Anes PTL Lv  1 Current             Social History   Socioeconomic History  . Marital status: Single    Spouse name: Not on file  . Number of children: Not on file  . Years of education: Not on file  . Highest education level: Not on file  Occupational History  . Not on file  Tobacco Use  . Smoking status: Never Smoker  . Smokeless tobacco: Never Used  Vaping Use  . Vaping Use: Never used  Substance and Sexual Activity  . Alcohol use: Not Currently  . Drug use: Never  . Sexual activity: Yes    Birth control/protection: None  Other Topics Concern  . Not on file  Social History Narrative  . Not on file      Patient has no known allergies.    Prenatal Transfer Tool  Maternal Diabetes: No Genetic Screening: Normal Maternal Ultrasounds/Referrals: Normal Fetal Ultrasounds or other Referrals:  None Maternal Substance Abuse:  No Significant Maternal Medications:  Meds include: Other:  Pepcid Significant Maternal Lab Results: Group B Strep positive  ABO, Rh: --/--/O POS (09/20 7741) Antibody: NEG (09/20 0337) Rubella:  Immune RPR:   NR HBsAg:   Negative HIV:   Negative GBS:   Positive      Vitals:   11/10/19 0700 11/10/19 0720  BP:   132/67  Pulse: (!) 102 (!) 101  Resp:  18  Temp:    SpO2:       General:  NAD Abdomen:  soft, gravid, EFW 7# Ex:  no edema SVE:  7/90/-2 FHTs:  130s, moderate variability, + accelerations, category 1 Toco:  q2-4 minutes   A/P   24 y.o. G1P0 [redacted]w[redacted]d presents with labor Progressing well without augmentation Preeclampsia--BPs mild range, asymptomatic.  Will monitor closely  FSR/ vtx/ GBS negative  Delfina Schreurs GEFFEL Imanie Darrow

## 2019-11-10 NOTE — Anesthesia Postprocedure Evaluation (Signed)
Anesthesia Post Note  Patient: LAIANA FRATUS  Procedure(s) Performed: AN AD HOC LABOR EPIDURAL     Patient location during evaluation: Mother Baby Anesthesia Type: Epidural Level of consciousness: awake and alert and oriented Pain management: satisfactory to patient Vital Signs Assessment: post-procedure vital signs reviewed and stable Respiratory status: respiratory function stable Cardiovascular status: stable Postop Assessment: no headache, no backache, epidural receding, patient able to bend at knees, no signs of nausea or vomiting and adequate PO intake Anesthetic complications: no   No complications documented.  Last Vitals:  Vitals:   11/10/19 1240 11/10/19 1340  BP: (!) 144/72 139/75  Pulse: 97 99  Resp: 18 17  Temp: 36.8 C 36.7 C  SpO2: 100% 97%    Last Pain:  Vitals:   11/10/19 1340  TempSrc: Oral  PainSc: 0-No pain   Pain Goal: Patients Stated Pain Goal: 0 (11/10/19 0010)                 Nikash Mortensen

## 2019-11-10 NOTE — Anesthesia Preprocedure Evaluation (Signed)
Anesthesia Evaluation  Patient identified by MRN, date of birth, ID band Patient awake    Reviewed: Allergy & Precautions, Patient's Chart, lab work & pertinent test results  Airway Mallampati: II  TM Distance: >3 FB Neck ROM: Full    Dental no notable dental hx. (+) Teeth Intact   Pulmonary neg pulmonary ROS,    Pulmonary exam normal        Cardiovascular hypertension, Normal cardiovascular exam Rhythm:Regular Rate:Normal     Neuro/Psych negative neurological ROS  negative psych ROS   GI/Hepatic Neg liver ROS, GERD  ,  Endo/Other  Obesity  Renal/GU negative Renal ROS  negative genitourinary   Musculoskeletal negative musculoskeletal ROS (+)   Abdominal (+) + obese,   Peds  Hematology negative hematology ROS (+)   Anesthesia Other Findings   Reproductive/Obstetrics (+) Pregnancy                             Anesthesia Physical Anesthesia Plan  ASA: II  Anesthesia Plan: Epidural   Post-op Pain Management:    Induction:   PONV Risk Score and Plan:   Airway Management Planned: Natural Airway  Additional Equipment:   Intra-op Plan:   Post-operative Plan:   Informed Consent: I have reviewed the patients History and Physical, chart, labs and discussed the procedure including the risks, benefits and alternatives for the proposed anesthesia with the patient or authorized representative who has indicated his/her understanding and acceptance.       Plan Discussed with: Anesthesiologist  Anesthesia Plan Comments:         Anesthesia Quick Evaluation

## 2019-11-10 NOTE — MAU Note (Signed)
Pt reports to MAU complaining of CTX every 4-6. Denies vaginal bleeding or LOF. +FM.  GBS+

## 2019-11-10 NOTE — MAU Provider Note (Signed)
History     CSN: [id number]  Arrival date and time: 11/09/19 2350   None     Chief Complaint  Patient presents with   Contractions   HPI Patient Tonya Mclean is a 24 y.o. G1P0  at [redacted]w[redacted]d here with complaints of contractions. She denies LOF, VB, decreased fetal movements, HA, floating spots, blurry vision, RUQ pain. She reports no complications with blood sugar or blood pressure in pregnancy.  She was here for a routine labor check but found to have elevated BPs while in MAU so pre-e labs were drawn.   OB History     Gravida  1   Para      Term      Preterm      AB      Living         SAB      TAB      Ectopic      Multiple      Live Births              Past Medical History:  Diagnosis Date   Medical history non-contributory     Past Surgical History:  Procedure Laterality Date   NO PAST SURGERIES      History reviewed. No pertinent family history.  Social History   Tobacco Use   Smoking status: Never Smoker   Smokeless tobacco: Never Used  Vaping Use   Vaping Use: Never used  Substance Use Topics   Alcohol use: Not Currently   Drug use: Never    Allergies: No Known Allergies  Medications Prior to Admission  Medication Sig Dispense Refill Last Dose   Prenatal Vit-Fe Fumarate-FA (MULTIVITAMIN-PRENATAL) 27-0.8 MG TABS tablet Take 1 tablet by mouth daily at 12 noon.   11/09/2019 at Unknown time   cephALEXin (KEFLEX) 500 MG capsule Take 1 capsule (500 mg total) by mouth 2 (two) times daily. 6 capsule 0    ibuprofen (ADVIL,MOTRIN) 200 MG tablet Take 400 mg by mouth daily as needed (pain).      ondansetron (ZOFRAN ODT) 4 MG disintegrating tablet Take 1 tablet (4 mg total) by mouth every 8 (eight) hours as needed for nausea or vomiting. (Patient not taking: Reported on 02/25/2019) 10 tablet 0     Review of Systems  Constitutional: Negative.   HENT: Negative.   Respiratory: Negative.   Cardiovascular: Negative.   Gastrointestinal: Positive  for abdominal pain.  Musculoskeletal: Negative.   Neurological: Negative.   Hematological: Negative.   Psychiatric/Behavioral: Negative.    Physical Exam   Blood pressure 140/79, pulse 89, temperature 97.8 F (36.6 C), temperature source Oral, resp. rate 17, height 5' 3.5" (1.613 m), weight 86.2 kg, SpO2 99 %.  Physical Exam Constitutional:      Appearance: Normal appearance.  HENT:     Head: Normocephalic.  Cardiovascular:     Rate and Rhythm: Normal rate.  Musculoskeletal:        General: Normal range of motion.     Cervical back: Normal range of motion.  Skin:    General: Skin is warm.  Neurological:     Mental Status: She is alert.     MAU Course  Procedures  MDM -NST: 145 bpm, mod var, present acel, no decels, ctx 2-5 min apartm CBC and CMP normal, PCR is 0.69 so meets criteria for Pre-e.    Assessment and Plan  Discussed with Dr. Tenny Craw and reocmmended admission, Dr. Tenny Craw agrees and asks for verbal admission orders to  be placed, as well as plan for cytotec for IOL and GBS prophlaxis.   Charlesetta Garibaldi Tasfia Vasseur 11/10/2019, 1:40 AM

## 2019-11-10 NOTE — Anesthesia Procedure Notes (Signed)

## 2019-11-10 NOTE — Progress Notes (Signed)
Dr Tenny Craw obtaining prenatals. Called away before obtaining the prenatals for this patient. Dr Chestine Spore aware prenatals not available. [employer] called and asked by RN to fax over prenatals & labs.

## 2019-11-11 LAB — CBC
HCT: 31.4 % — ABNORMAL LOW (ref 36.0–46.0)
Hemoglobin: 10.7 g/dL — ABNORMAL LOW (ref 12.0–15.0)
MCH: 30.4 pg (ref 26.0–34.0)
MCHC: 34.1 g/dL (ref 30.0–36.0)
MCV: 89.2 fL (ref 80.0–100.0)
Platelets: 187 10*3/uL (ref 150–400)
RBC: 3.52 MIL/uL — ABNORMAL LOW (ref 3.87–5.11)
RDW: 13.3 % (ref 11.5–15.5)
WBC: 13.4 10*3/uL — ABNORMAL HIGH (ref 4.0–10.5)
nRBC: 0 % (ref 0.0–0.2)

## 2019-11-11 NOTE — Lactation Note (Signed)
This note was copied from a baby's chart. Lactation Consultation Note  Patient Name: Tonya Mclean Date: 11/11/2019 Reason for consult: Follow-up assessment  Follow up with 33 hours old with 3% weight loss of a P1 mother. Mother states she is formula and breastfeeding. Mother explains infant goes to breast ~60minutes and then ~13mL of bottle-fed formula. Discussed supplementation guidelines, stomach size and paced bottle feeding. Encouraged frequent burping and sit up position. Encouraged to contact lactation services as needed for any questions or concerns.    Maternal Data Formula Feeding for Exclusion: No  Feeding Feeding Type: Breast Milk Nipple Type: Slow - flow  Interventions Interventions: Breast feeding basics reviewed  Consult Status Consult Status: Follow-up Follow-up type: In-patient    Smiley Birr A Higuera Ancidey 11/11/2019, 8:18 PM

## 2019-11-11 NOTE — Progress Notes (Signed)
PPD#1 Pt without complaints. Lochia mild, No headache VSSAF IMP/ Stable with h.o. high blood pressures Plan/ Will follow B/Ps If nl will send home tomorrow

## 2019-11-12 ENCOUNTER — Encounter (HOSPITAL_COMMUNITY): Payer: Self-pay | Admitting: Obstetrics

## 2019-11-12 NOTE — Lactation Note (Signed)
This note was copied from a baby's chart. Lactation Consultation Note  Patient Name: Tonya Mclean MCNOB'S Date: 11/12/2019 Reason for consult: Follow-up assessment;Primapara;1st time breastfeeding;Infant weight loss;Term  Visited with mom of a 69 hours old FT female, she's a P1 and supplementing with Similac formula per choice on admission. Noticed that mom is offering large volumes of formula to baby, reviewed again size of baby's stomach and supplementation guidelines and how offering formula and not the breast (or not pumping) can hurt her supply.  Mom told LC baby just fed at the breast for 4 minutes prior entering the room, 2 minutes on each side. Mom and baby are going home today, baby is at 2% weight loss. Reviewed discharge instructions, engorgement prevention, treatment, treatment/prevention for sore nipples, lactogenesis II and supply/demand. Mom's sister was her support person at the time of [facility] consultation.  She voiced she was interested on switching from Similac to Enfamil, redirected her to baby's pediatrician, mom and baby already have their discharge order in. Family reported all lactation questions and concerns were answered, they're both aware of LC OP services and will contact PRN.   Maternal Data    Feeding Feeding Type: Breast Fed  LATCH Score                   Interventions Interventions: Breast feeding basics reviewed  Lactation Tools Discussed/Used     Consult Status Consult Status: Complete Date: 11/12/19 Follow-up type: Call as needed    Hayla Hinger Venetia Constable 11/12/2019, 11:21 AM

## 2019-11-12 NOTE — Progress Notes (Signed)
Post Partum Day 2 Subjective: Doing well this morning without complaints. Ambulating, voiding, tolerating PO. Minimal lochia. Minimal pain.   Objective: Patient Vitals for the past 24 hrs:  BP Temp Temp src Resp SpO2  11/12/19 0542 127/60 98 F (36.7 C) Oral 18 100 %    Physical Exam:  General: alert, cooperative and no distress Lochia: appropriate Uterine Fundus: firm DVT Evaluation: No evidence of DVT seen on physical exam.  Recent Labs    11/10/19 0208 11/10/19 1127 11/11/19 0528  WBC 11.7* 15.3* 13.4*  HGB 12.8 11.9* 10.7*  HCT 37.9 35.1* 31.4*  PLT 256 225 187    Recent Labs    11/10/19 0208  NA 135  K 3.8  CL 105  BUN 8  CREATININE 0.55  GLUCOSE 109*  BILITOT 0.3  ALT 15  AST 20  ALKPHOS 236*  PROT 6.5  ALBUMIN 3.1*    Recent Labs    11/10/19 0208  CALCIUM 9.9    No results for input(s): PROTIME, APTT, INR in the last 72 hours.  No results for input(s): PROTIME, APTT, INR, FIBRINOGEN in the last 72 hours. Assessment/Plan:   Tonya Mclean 24 y.o. G1P1001 PPD#2 sp SVD 1. PPC: continue routine postpartum care 2. Preeclampsia: BPs normal for the past 24 hours, though previously elevated, will have BP check in office early next week. 3. Rh positive does not need Rhogam 4. Stable for discharge home with BP check next week. Given preeclampsia precautions.     LOS: 2 days   Tonya Mclean 11/12/2019, 1:00 PM

## 2019-11-12 NOTE — Discharge Summary (Signed)
Postpartum Discharge Summary     Patient Name: Tonya Mclean DOB: 10/07/1995 MRN: [id number]  Date of admission: 11/09/2019 Delivery date:11/10/2019  Delivering provider: Jerelyn Charles  Date of discharge: 11/12/2019  Admitting diagnosis: Preeclampsia [O14.90] Intrauterine pregnancy: [redacted]w[redacted]d    Secondary diagnosis:  Active Problems:   Preeclampsia  Additional problems: none    Discharge diagnosis: Term Pregnancy Delivered and Preeclampsia (mild)                                              Post partum procedures:none Complications: None  Hospital course: Onset of Labor With Vaginal Delivery      24y.o. yo G1P1001 at 380w1das admitted in Active Labor on 11/09/2019. Patient had an uncomplicated labor course as follows:  Membrane Rupture Time/Date: 7:52 AM ,11/10/2019   Delivery Method:Vaginal, Spontaneous  Episiotomy: None  Lacerations:  2nd degree;Labial;Sulcus  Patient had an uncomplicated postpartum course.  She is ambulating, tolerating a regular diet, passing flatus, and urinating well. Patient is discharged home in stable condition on 11/12/19.  Newborn Data: Birth date:11/10/2019  Birth time:10:22 AM  Gender:Female  Living status:Living  Apgars:9 ,9  Weight:3715 g   Magnesium Sulfate received: No BMZ received: No Rhophylac:No MMR:No T-DaP:Given prenatally Flu: No Transfusion:No  Physical exam  Vitals:   11/11/19 0130 11/11/19 0459 11/11/19 1300 11/12/19 0542  BP: 129/84 130/67 129/68 127/60  Pulse: 87 86 82   Resp: _0 Temp: 98.4 F (36.9 C) 97.9 F (36.6 C) 98 F (36.7 C) 98 F (36.7 C)  TempSrc: Oral Oral Oral Oral  SpO2: 100% 99% 100% 100%  Weight:      Height:       General: alert, cooperative and no distress Lochia: appropriate Uterine Fundus: firm DVT Evaluation: No evidence of DVT seen on physical exam. Labs: Lab Results  Component Value Date   WBC 13.4 (H) 11/11/2019   HGB 10.7 (L) 11/11/2019   HCT 31.4 (L) 11/11/2019   MCV  89.2 11/11/2019   PLT 187 11/11/2019   CMP Latest Ref Rng & Units 11/10/2019  Glucose 70 - 99 mg/dL 109(H)  BUN 6 - 20 mg/dL 8  Creatinine 0.44 - 1.00 mg/dL 0.55  Sodium 135 - 145 mmol/L 135  Potassium 3.5 - 5.1 mmol/L 3.8  Chloride 98 - 111 mmol/L 105  CO2 22 - 32 mmol/L 17(L)  Calcium 8.9 - 10.3 mg/dL 9.9  Total Protein 6.5 - 8.1 g/dL 6.5  Total Bilirubin 0.3 - 1.2 mg/dL 0.3  Alkaline Phos 38 - 126 U/L 236(H)  AST 15 - 41 U/L 20  ALT 0 - 44 U/L 15   Edinburgh Score: Edinburgh Postnatal Depression Scale Screening Tool 11/10/2019  I have been able to laugh and see the funny side of things. 0  I have looked forward with enjoyment to things. 0  I have blamed myself unnecessarily when things went wrong. 0  I have been anxious or worried for no good reason. 0  I have felt scared or panicky for no good reason. 0  Things have been getting on top of me. 0  I have been so unhappy that I have had difficulty sleeping. 0  I have felt sad or miserable. 0  I have been so unhappy that I have been crying. 0  The thought of harming myself has occurred  to me. 0  Edinburgh Postnatal Depression Scale Total 0    Discharge home in stable condition Infant Feeding: Breast Infant Disposition:home with mother Discharge instruction: per After Visit Summary and Postpartum booklet. Activity: Advance as tolerated. Pelvic rest for 6 weeks.  Diet: routine diet Future Appointments BP check 1 week  Please schedule this patient for a In person postpartum visit in 1 week with the following provider: MD. Additional Postpartum F/U:BP check 1 week  Low risk pregnancy complicated by: preeclampsia Delivery mode:  Vaginal, Spontaneous    11/12/2019 Rowland Lathe, MD

## 2021-02-20 HISTORY — PX: WISDOM TOOTH EXTRACTION: SHX21

## 2022-01-30 LAB — OB RESULTS CONSOLE GC/CHLAMYDIA
Chlamydia: NEGATIVE
Neisseria Gonorrhea: NEGATIVE

## 2022-02-18 ENCOUNTER — Inpatient Hospital Stay (HOSPITAL_COMMUNITY)
Admission: AD | Admit: 2022-02-18 | Discharge: 2022-02-18 | Disposition: A | Discharge status: Home / Self Care | Payer: Medicaid Other | Attending: Obstetrics and Gynecology | Admitting: Obstetrics and Gynecology

## 2022-02-18 ENCOUNTER — Encounter (HOSPITAL_COMMUNITY): Payer: Self-pay | Admitting: *Deleted

## 2022-02-18 ENCOUNTER — Other Ambulatory Visit: Payer: Self-pay

## 2022-02-18 DIAGNOSIS — Z3A10 10 weeks gestation of pregnancy: Secondary | ICD-10-CM | POA: Diagnosis not present

## 2022-02-18 DIAGNOSIS — O219 Vomiting of pregnancy, unspecified: Secondary | ICD-10-CM | POA: Insufficient documentation

## 2022-02-18 LAB — URINALYSIS, ROUTINE W REFLEX MICROSCOPIC
Bilirubin Urine: NEGATIVE
Glucose, UA: NEGATIVE mg/dL
Hgb urine dipstick: NEGATIVE
Ketones, ur: 20 mg/dL — AB
Leukocytes,Ua: NEGATIVE
Nitrite: NEGATIVE
Protein, ur: NEGATIVE mg/dL
Specific Gravity, Urine: 1.025 (ref 1.005–1.030)
pH: 5 (ref 5.0–8.0)

## 2022-02-18 LAB — POCT PREGNANCY, URINE: Preg Test, Ur: POSITIVE — AB

## 2022-02-18 MED ORDER — SCOPOLAMINE 1 MG/3DAYS TD PT72: 1.0000 | MEDICATED_PATCH | TRANSDERMAL | 0 refills | Status: DC

## 2022-02-18 MED ORDER — FAMOTIDINE 20 MG PO TABS: 20.0000 mg | ORAL_TABLET | Freq: Two times a day (BID) | ORAL | 0 refills | Status: DC

## 2022-02-18 MED ORDER — ONDANSETRON 8 MG PO TBDP: 8.0000 mg | ORAL_TABLET | Freq: Three times a day (TID) | ORAL | 0 refills | Status: DC | PRN

## 2022-02-18 MED ORDER — SCOPOLAMINE 1 MG/3DAYS TD PT72
1.0000 | MEDICATED_PATCH | Freq: Once | TRANSDERMAL | Status: DC
  Administered 2022-02-18: 1.5 mg via TRANSDERMAL
  Filled 2022-02-18: qty 1

## 2022-02-18 MED ORDER — PROMETHAZINE HCL 12.5 MG PO TABS: 12.5000 mg | ORAL_TABLET | Freq: Four times a day (QID) | ORAL | 0 refills | Status: DC | PRN

## 2022-02-18 MED ORDER — ONDANSETRON 4 MG PO TBDP
4.0000 mg | ORAL_TABLET | Freq: Once | ORAL | Status: AC
  Administered 2022-02-18: 4 mg via ORAL
  Filled 2022-02-18: qty 1

## 2022-02-18 NOTE — Progress Notes (Signed)
Po fluids & crackers given to po challenge post Zofran administration.

## 2022-02-18 NOTE — Progress Notes (Signed)
Pt reports she was unable to keep water down and "throwing up acid" .Marland Kitchen CNM notified.

## 2022-02-18 NOTE — MAU Note (Signed)
Tonya Mclean is a 26 y.o. at Unknown here in MAU reporting: she's here for IVF's, reports can't keep anything down including water.  Reports she's vomited 5x in the last 24 hours. LMP: 12/04/2021 Onset of complaint: today Pain score: 0 Vitals:   02/18/22 1601  BP: 120/64  Pulse: 78  Resp: 19  Temp: 98.4 F (36.9 C)  SpO2: 100%     FHT: 162 bpm Lab orders placed from triage:   UPT & UA

## 2022-02-18 NOTE — Progress Notes (Signed)
Per pt report she vomited twice since Zofran administration, once directly after Zofran and again after consuming crackers & water.   Rhunette Croft notified regarding pt report of continued emesis.

## 2022-02-18 NOTE — MAU Provider Note (Signed)
History     CSN: [id number]  Arrival date and time: 02/18/22 1459   Event Date/Time   First Provider Initiated Contact with Patient 02/18/22 1700      Chief Complaint  Patient presents with   Emesis   Nausea   Tonya Mclean , a  26 y.o. G2P1001 at [redacted]w[redacted]d presents to MAU with complaints of on-going nausea and vomiting. Patient states she had 4 episodes of vomiting yesterday and one time today. She states she attempted Zofran, without relief. Last dose at 11am. Diet recall, 1 small muffin and some yogurt @ 1pm. She states she vomited 10 mins after ingestion. Denies vomiting since. She states she was prescribed a phenergan suppository yesterday and used 1 but had a BM immediately after. Patient denies emesis since arrival to MAU and states "its only with meals, and especially with water." Endorses feeling weak but denies lightheaded and dizziness.       D/t MAU patient acuity, ordered placed from triage.     OB History     Gravida  2   Para  1   Term  1   Preterm      AB      Living  1      SAB      IAB      Ectopic      Multiple  0   Live Births  1           Past Medical History:  Diagnosis Date   Medical history non-contributory     Past Surgical History:  Procedure Laterality Date   NO PAST SURGERIES      No family history on file.  Social History   Tobacco Use   Smoking status: Never   Smokeless tobacco: Never  Vaping Use   Vaping Use: Never used  Substance Use Topics   Alcohol use: Not Currently   Drug use: Never    Allergies: No Known Allergies  Medications Prior to Admission  Medication Sig Dispense Refill Last Dose   ibuprofen (ADVIL,MOTRIN) 200 MG tablet Take 400 mg by mouth daily as needed (pain).       Review of Systems  Constitutional:  Negative for chills, fatigue and fever.  Eyes:  Negative for pain and visual disturbance.  Respiratory:  Negative for apnea, shortness of breath and wheezing.   Cardiovascular:   Negative for chest pain and palpitations.  Gastrointestinal:  Positive for nausea and vomiting. Negative for abdominal pain, constipation and diarrhea.  Genitourinary:  Negative for difficulty urinating, dysuria, pelvic pain, vaginal bleeding, vaginal discharge and vaginal pain.  Musculoskeletal:  Negative for back pain.  Neurological:  Negative for dizziness, seizures, weakness, light-headedness and headaches.  Psychiatric/Behavioral:  Negative for suicidal ideas.    Physical Exam   Blood pressure 120/64, pulse 78, temperature 98.4 F (36.9 C), temperature source Oral, resp. rate 19, height 5\' 3"  (1.6 m), weight 63 kg, last menstrual period 12/04/2021, SpO2 100 %, unknown if currently breastfeeding.  Physical Exam Vitals and nursing note reviewed.  Constitutional:      General: She is not in acute distress.    Appearance: Normal appearance. She is not ill-appearing.  HENT:     Head: Normocephalic.  Pulmonary:     Effort: Pulmonary effort is normal.     Breath sounds: Normal breath sounds.  Musculoskeletal:     Cervical back: Normal range of motion.  Skin:    General: Skin is warm and dry.  Capillary Refill: Capillary refill takes less than 2 seconds.  Neurological:     Mental Status: She is alert and oriented to person, place, and time.  Psychiatric:        Mood and Affect: Mood normal.    FHT of 162bpm obtained in Triage   MAU Course  Procedures Orders Placed This Encounter  Procedures   Urinalysis, Routine w reflex microscopic Urine, Clean Catch   Pregnancy, urine POC   Results for orders placed or performed during the hospital encounter of 02/18/22 (from the past 24 hour(s))  Pregnancy, urine POC     Status: Abnormal   Collection Time: 02/18/22  3:40 PM  Result Value Ref Range   Preg Test, Ur POSITIVE (A) NEGATIVE  Urinalysis, Routine w reflex microscopic Urine, Clean Catch     Status: Abnormal   Collection Time: 02/18/22  3:50 PM  Result Value Ref Range    Color, Urine YELLOW YELLOW   APPearance HAZY (A) CLEAR   Specific Gravity, Urine 1.025 1.005 - 1.030   pH 5.0 5.0 - 8.0   Glucose, UA NEGATIVE NEGATIVE mg/dL   Hgb urine dipstick NEGATIVE NEGATIVE   Bilirubin Urine NEGATIVE NEGATIVE   Ketones, ur 20 (A) NEGATIVE mg/dL   Protein, ur NEGATIVE NEGATIVE mg/dL   Nitrite NEGATIVE NEGATIVE   Leukocytes,Ua NEGATIVE NEGATIVE   Meds ordered this encounter  Medications   ondansetron (ZOFRAN-ODT) disintegrating tablet 4 mg     MDM - Last Episode of vomiting was >3 hours ago.  -  20 of Ketones in urine, otherwise normal. Low suspicion for dehydration.  - ODT zofran ordered and PO challenged.  - Patient reports she vomited the crackers and water 10 mins after eating.  - Scop patch ordered and given.    - Plan for discharge.   Assessment and Plan   1. Nausea and vomiting in pregnancy   2. [redacted] weeks gestation of pregnancy    - Reviewed that 1st trimester nausea and vomiting can be a normal discomfort of pregnancy. Discussed that symptoms improve in 2nd trimester.  - Recommended a nausea regimen to incorporate into her daily life.  - Rx for Meds provided in MAU sent to outpatient pharmacy.  - Reviewed non-pharmological options for N/V including sea bands and ginger chews.  - Worsening signs and return precautions reviewed with patient.  - Patient discharged home in stable condition and may return to MAU as needed.   Claudette Head, MSN CNM  02/18/2022, 5:00 PM

## 2022-02-20 NOTE — L&D Delivery Note (Signed)
Delivery Note She progressed to complete and pushed well with just 3 ctx.  At 11:23 PM a viable female was delivered via Vaginal, Spontaneous (Presentation: Left Occiput Anterior).  APGAR: 9, 9; weight  pending.   Placenta status: Spontaneous, Intact.  Cord: 3 vessels with the following complications: None.   Anesthesia: Epidural Episiotomy: None Lacerations: 1st degree;Labial;Perineal Suture Repair: 3.0 vicryl rapide Est. Blood Loss (mL): 122  Mom to postpartum.  Baby to Couplet care / Skin to Skin.  Leighton Roach Esiquio Boesen 09/02/2022, 12:05 AM

## 2022-02-27 LAB — HEPATITIS C ANTIBODY: HCV Ab: NEGATIVE

## 2022-02-27 LAB — OB RESULTS CONSOLE HEPATITIS B SURFACE ANTIGEN: Hepatitis B Surface Ag: NEGATIVE

## 2022-02-27 LAB — OB RESULTS CONSOLE HIV ANTIBODY (ROUTINE TESTING): HIV: NONREACTIVE

## 2022-02-27 LAB — OB RESULTS CONSOLE RPR: RPR: NONREACTIVE

## 2022-02-27 LAB — OB RESULTS CONSOLE RUBELLA ANTIBODY, IGM: Rubella: IMMUNE

## 2022-06-08 LAB — OB RESULTS CONSOLE RPR: RPR: NONREACTIVE

## 2022-07-16 ENCOUNTER — Encounter (HOSPITAL_COMMUNITY): Payer: Self-pay | Admitting: Obstetrics

## 2022-07-16 ENCOUNTER — Inpatient Hospital Stay (HOSPITAL_COMMUNITY)
Admission: AD | Admit: 2022-07-16 | Discharge: 2022-07-17 | Disposition: A | Discharge status: Home / Self Care | Payer: Medicaid Other | Attending: Obstetrics | Admitting: Obstetrics

## 2022-07-16 DIAGNOSIS — M791 Myalgia, unspecified site: Secondary | ICD-10-CM | POA: Insufficient documentation

## 2022-07-16 DIAGNOSIS — O26893 Other specified pregnancy related conditions, third trimester: Secondary | ICD-10-CM | POA: Insufficient documentation

## 2022-07-16 DIAGNOSIS — M7918 Myalgia, other site: Secondary | ICD-10-CM

## 2022-07-16 DIAGNOSIS — Z3A32 32 weeks gestation of pregnancy: Secondary | ICD-10-CM | POA: Diagnosis not present

## 2022-07-16 DIAGNOSIS — R109 Unspecified abdominal pain: Secondary | ICD-10-CM | POA: Diagnosis present

## 2022-07-16 DIAGNOSIS — O26899 Other specified pregnancy related conditions, unspecified trimester: Secondary | ICD-10-CM

## 2022-07-16 LAB — URINALYSIS, ROUTINE W REFLEX MICROSCOPIC
Bilirubin Urine: NEGATIVE
Glucose, UA: 50 mg/dL — AB
Hgb urine dipstick: NEGATIVE
Ketones, ur: NEGATIVE mg/dL
Nitrite: NEGATIVE
Protein, ur: 30 mg/dL — AB
Specific Gravity, Urine: 1.023 (ref 1.005–1.030)
pH: 6 (ref 5.0–8.0)

## 2022-07-16 LAB — CBC WITH DIFFERENTIAL/PLATELET
Abs Immature Granulocytes: 0.1 10*3/uL — ABNORMAL HIGH (ref 0.00–0.07)
Basophils Absolute: 0 10*3/uL (ref 0.0–0.1)
Basophils Relative: 0 %
Eosinophils Absolute: 0.2 10*3/uL (ref 0.0–0.5)
Eosinophils Relative: 2 %
HCT: 32 % — ABNORMAL LOW (ref 36.0–46.0)
Hemoglobin: 11 g/dL — ABNORMAL LOW (ref 12.0–15.0)
Immature Granulocytes: 1 %
Lymphocytes Relative: 27 %
Lymphs Abs: 2.5 10*3/uL (ref 0.7–4.0)
MCH: 29.6 pg (ref 26.0–34.0)
MCHC: 34.4 g/dL (ref 30.0–36.0)
MCV: 86.3 fL (ref 80.0–100.0)
Monocytes Absolute: 0.7 10*3/uL (ref 0.1–1.0)
Monocytes Relative: 8 %
Neutro Abs: 5.8 10*3/uL (ref 1.7–7.7)
Neutrophils Relative %: 62 %
Platelets: 206 10*3/uL (ref 150–400)
RBC: 3.71 MIL/uL — ABNORMAL LOW (ref 3.87–5.11)
RDW: 12.7 % (ref 11.5–15.5)
WBC: 9.3 10*3/uL (ref 4.0–10.5)
nRBC: 0 % (ref 0.0–0.2)

## 2022-07-16 NOTE — MAU Provider Note (Signed)
History     CSN: [id number]  Arrival date and time: 07/16/22 2206   Event Date/Time   First Provider Initiated Contact with Patient 07/16/22 2242      Chief Complaint  Patient presents with   Abdominal Pain   Tonya Mclean , a  27 y.o. G2P1001 at [redacted]w[redacted]d presents to MAU with complaints of mid right sided abdominal pain for the last few days. She states that this pain is new, intermittent and last about 1 mins. She states that its happened 3 times in 1 hour yesterday. She states that the pain is "stabbing" and its worsened by fetal movement. Reports that when it starts to subside it radiates around to her back. , She states that if her lifts her belly up the pain is relieved "a little bit." She currently rates pain a 8/10 and denies attempting to relieve it. She endorses positive fetal movement. She denies abnormal vaginal discharge, vaginal bleeding, leaking of fluid or contractions. She states she wonders if it is braxton hicks.           OB History     Gravida  2   Para  1   Term  1   Preterm      AB      Living  1      SAB      IAB      Ectopic      Multiple  0   Live Births  1           Past Medical History:  Diagnosis Date   Medical history non-contributory     Past Surgical History:  Procedure Laterality Date   NO PAST SURGERIES      History reviewed. No pertinent family history.  Social History   Tobacco Use   Smoking status: Never   Smokeless tobacco: Never  Vaping Use   Vaping Use: Never used  Substance Use Topics   Alcohol use: Not Currently   Drug use: Never    Allergies: No Known Allergies  Medications Prior to Admission  Medication Sig Dispense Refill Last Dose   Prenatal Vit-Fe Fumarate-FA (MULTIVITAMIN-PRENATAL) 27-0.8 MG TABS tablet Take 1 tablet by mouth daily at 12 noon.   07/16/2022   famotidine (PEPCID) 20 MG tablet Take 1 tablet (20 mg total) by mouth 2 (two) times daily. 30 tablet 0    ondansetron (ZOFRAN-ODT) 8  MG disintegrating tablet Take 1 tablet (8 mg total) by mouth every 8 (eight) hours as needed for nausea or vomiting. 20 tablet 0    promethazine (PHENERGAN) 12.5 MG tablet Take 1 tablet (12.5 mg total) by mouth every 6 (six) hours as needed for nausea or vomiting. 30 tablet 0    scopolamine (TRANSDERM-SCOP) 1 MG/3DAYS Place 1 patch (1.5 mg total) onto the skin every 3 (three) days. 10 patch 0     Review of Systems  Constitutional:  Negative for chills, fatigue and fever.  Eyes:  Negative for pain and visual disturbance.  Respiratory:  Negative for apnea, shortness of breath and wheezing.   Cardiovascular:  Negative for chest pain and palpitations.  Gastrointestinal:  Positive for abdominal pain. Negative for constipation, diarrhea, nausea and vomiting.  Genitourinary:  Negative for difficulty urinating, dysuria, pelvic pain, vaginal bleeding, vaginal discharge and vaginal pain.  Musculoskeletal:  Negative for back pain.  Neurological:  Negative for seizures, weakness and headaches.  Psychiatric/Behavioral:  Negative for suicidal ideas.    Physical Exam   Blood pressure 120/63,  pulse 82, temperature 97.9 F (36.6 C), resp. rate 18, height 5\' 3"  (1.6 m), weight 78.5 kg, last menstrual period 12/04/2021, SpO2 99 %, unknown if currently breastfeeding.  Physical Exam Vitals and nursing note reviewed.  Constitutional:      General: She is not in acute distress.    Appearance: Normal appearance.  HENT:     Head: Normocephalic.  Pulmonary:     Effort: Pulmonary effort is normal.  Abdominal:     Palpations: Abdomen is soft.     Tenderness: There is abdominal tenderness. There is guarding. There is no rebound.     Comments: Pregnant; On palpation tenderness more prevalent over fetal small parts. Gross fetal movement palpated on exam   Musculoskeletal:     Cervical back: Normal range of motion.  Skin:    General: Skin is warm and dry.  Neurological:     Mental Status: She is alert and  oriented to person, place, and time.  Psychiatric:        Mood and Affect: Mood normal.    FHT: 135 bpm with moderate variability accels present, no decels noted (appropriate for gestational age)  Toco: irregular contractions noted.Marland Kitchen    MAU Course  Procedures Orders Placed This Encounter  Procedures   Culture, OB Urine   Urinalysis, Routine w reflex microscopic -Urine, Clean Catch   CBC with Differential/Platelet   Comprehensive metabolic panel   Results for orders placed or performed during the hospital encounter of 07/16/22 (from the past 24 hour(s))  Urinalysis, Routine w reflex microscopic -Urine, Clean Catch     Status: Abnormal   Collection Time: 07/16/22 10:25 PM  Result Value Ref Range   Color, Urine YELLOW YELLOW   APPearance HAZY (A) CLEAR   Specific Gravity, Urine 1.023 1.005 - 1.030   pH 6.0 5.0 - 8.0   Glucose, UA 50 (A) NEGATIVE mg/dL   Hgb urine dipstick NEGATIVE NEGATIVE   Bilirubin Urine NEGATIVE NEGATIVE   Ketones, ur NEGATIVE NEGATIVE mg/dL   Protein, ur 30 (A) NEGATIVE mg/dL   Nitrite NEGATIVE NEGATIVE   Leukocytes,Ua SMALL (A) NEGATIVE   RBC / HPF 0-5 0 - 5 RBC/hpf   WBC, UA 6-10 0 - 5 WBC/hpf   Bacteria, UA FEW (A) NONE SEEN   Squamous Epithelial / HPF 11-20 0 - 5 /HPF   Mucus PRESENT   CBC with Differential/Platelet     Status: Abnormal   Collection Time: 07/16/22 11:22 PM  Result Value Ref Range   WBC 9.3 4.0 - 10.5 K/uL   RBC 3.71 (L) 3.87 - 5.11 MIL/uL   Hemoglobin 11.0 (L) 12.0 - 15.0 g/dL   HCT 16.1 (L) 09.6 - 04.5 %   MCV 86.3 80.0 - 100.0 fL   MCH 29.6 26.0 - 34.0 pg   MCHC 34.4 30.0 - 36.0 g/dL   RDW 40.9 81.1 - 91.4 %   Platelets 206 150 - 400 K/uL   nRBC 0.0 0.0 - 0.2 %   Neutrophils Relative % 62 %   Neutro Abs 5.8 1.7 - 7.7 K/uL   Lymphocytes Relative 27 %   Lymphs Abs 2.5 0.7 - 4.0 K/uL   Monocytes Relative 8 %   Monocytes Absolute 0.7 0.1 - 1.0 K/uL   Eosinophils Relative 2 %   Eosinophils Absolute 0.2 0.0 - 0.5 K/uL    Basophils Relative 0 %   Basophils Absolute 0.0 0.0 - 0.1 K/uL   Immature Granulocytes 1 %   Abs Immature Granulocytes 0.10 (H)  0.00 - 0.07 K/uL  Comprehensive metabolic panel     Status: Abnormal   Collection Time: 07/16/22 11:22 PM  Result Value Ref Range   Sodium 134 (L) 135 - 145 mmol/L   Potassium 3.3 (L) 3.5 - 5.1 mmol/L   Chloride 105 98 - 111 mmol/L   CO2 19 (L) 22 - 32 mmol/L   Glucose, Bld 89 70 - 99 mg/dL   BUN 8 6 - 20 mg/dL   Creatinine, Ser 1.61 0.44 - 1.00 mg/dL   Calcium 8.3 (L) 8.9 - 10.3 mg/dL   Total Protein 5.8 (L) 6.5 - 8.1 g/dL   Albumin 2.9 (L) 3.5 - 5.0 g/dL   AST 16 15 - 41 U/L   ALT 12 0 - 44 U/L   Alkaline Phosphatase 101 38 - 126 U/L   Total Bilirubin 0.3 0.3 - 1.2 mg/dL   GFR, Estimated >09 >60 mL/min   Anion gap 10 5 - 15     MDM - White count normal, vital signs stable. Low suspicion for infection  - No rebound tenderness and increasing pain on palpation - Low suspicion for appendicitis or pyelo  - Pain improved with PO Tylenol Flexeril and capsicin cream.  - plan for discharge   Assessment and Plan   1. Abdominal pain affecting pregnancy   2. [redacted] weeks gestation of pregnancy   3. Abdominal muscle pain    - Discussed that fetal movement can cause abdominal pain especially if located in the same exact spot over and over again.  - Reviewed some at home comfort measures.  - Reviewed worsening signs and return precautions reviewed.  - FHT appropriate for gestational age at time of discharge  - Patient discharged home in stable condition and may return to MAU as needed.   Claudette Head, MSN CNM  07/16/2022, 10:42 PM

## 2022-07-16 NOTE — MAU Note (Signed)
.  Tonya Mclean is a 27 y.o. at [redacted]w[redacted]d here in MAU reporting: since yesterday she has been having a sharp shooting pain in her right lower abd  that travels towards hr back when it eases up. Pain is intermittent and when it happens she has difficulty walking or moving. Denies any vag bleeding or leaking at this time and reports good fetal movement  Onset of complaint: yesterday Pain score: 8 Vitals:   07/16/22 2221  BP: 119/62  Pulse: 80  Resp: 18  Temp: 97.9 F (36.6 C)     FHT:140 Lab orders placed from triage:  u/a

## 2022-07-17 DIAGNOSIS — R109 Unspecified abdominal pain: Secondary | ICD-10-CM

## 2022-07-17 DIAGNOSIS — M7918 Myalgia, other site: Secondary | ICD-10-CM

## 2022-07-17 DIAGNOSIS — Z3A32 32 weeks gestation of pregnancy: Secondary | ICD-10-CM

## 2022-07-17 DIAGNOSIS — O26893 Other specified pregnancy related conditions, third trimester: Secondary | ICD-10-CM

## 2022-07-17 LAB — COMPREHENSIVE METABOLIC PANEL
ALT: 12 U/L (ref 0–44)
AST: 16 U/L (ref 15–41)
Albumin: 2.9 g/dL — ABNORMAL LOW (ref 3.5–5.0)
Alkaline Phosphatase: 101 U/L (ref 38–126)
Anion gap: 10 (ref 5–15)
BUN: 8 mg/dL (ref 6–20)
CO2: 19 mmol/L — ABNORMAL LOW (ref 22–32)
Calcium: 8.3 mg/dL — ABNORMAL LOW (ref 8.9–10.3)
Chloride: 105 mmol/L (ref 98–111)
Creatinine, Ser: 0.51 mg/dL (ref 0.44–1.00)
GFR, Estimated: >60 mL/min (ref >60)
Glucose, Bld: 89 mg/dL (ref 70–99)
Potassium: 3.3 mmol/L — ABNORMAL LOW (ref 3.5–5.1)
Sodium: 134 mmol/L — ABNORMAL LOW (ref 135–145)
Total Bilirubin: 0.3 mg/dL (ref 0.3–1.2)
Total Protein: 5.8 g/dL — ABNORMAL LOW (ref 6.5–8.1)

## 2022-07-17 LAB — CULTURE, OB URINE: Culture: 6000 — AB

## 2022-07-17 MED ORDER — CAPSAICIN 0.025 % EX CREA
TOPICAL_CREAM | Freq: Two times a day (BID) | CUTANEOUS | Status: DC
  Filled 2022-07-17: qty 60

## 2022-07-17 MED ORDER — CYCLOBENZAPRINE HCL 5 MG PO TABS
10.0000 mg | ORAL_TABLET | Freq: Once | ORAL | Status: AC
  Administered 2022-07-17: 10 mg via ORAL
  Filled 2022-07-17: qty 2

## 2022-07-17 MED ORDER — ACETAMINOPHEN 500 MG PO TABS
1000.0000 mg | ORAL_TABLET | Freq: Once | ORAL | Status: AC
  Administered 2022-07-17: 1000 mg via ORAL
  Filled 2022-07-17: qty 2

## 2022-08-17 LAB — OB RESULTS CONSOLE GBS: GBS: NEGATIVE

## 2022-08-31 ENCOUNTER — Encounter (HOSPITAL_COMMUNITY): Payer: Self-pay | Admitting: Obstetrics & Gynecology

## 2022-08-31 ENCOUNTER — Inpatient Hospital Stay (EMERGENCY_DEPARTMENT_HOSPITAL)
Admission: AD | Admit: 2022-08-31 | Discharge: 2022-08-31 | Disposition: A | Discharge status: Home / Self Care | Payer: Medicaid Other | Source: Home / Self Care | Attending: Obstetrics & Gynecology | Admitting: Obstetrics & Gynecology

## 2022-08-31 DIAGNOSIS — O479 False labor, unspecified: Secondary | ICD-10-CM | POA: Diagnosis not present

## 2022-08-31 DIAGNOSIS — O471 False labor at or after 37 completed weeks of gestation: Secondary | ICD-10-CM | POA: Insufficient documentation

## 2022-08-31 DIAGNOSIS — Z3A38 38 weeks gestation of pregnancy: Secondary | ICD-10-CM | POA: Insufficient documentation

## 2022-08-31 NOTE — MAU Note (Signed)
I have communicated with Wynelle Bourgeois CNM and reviewed vital signs:  Vitals:   08/31/22 2259 08/31/22 2300  BP: 136/77   Pulse: 84   Resp: 17   Temp:    SpO2: 100% 99%    Vaginal exam:  Dilation: 3 Effacement (%): 70 Cervical Position: Middle Station: -2 Presentation: Vertex Exam by:: Ginnie Smart RN,   Also reviewed contraction pattern and that non-stress test is reactive.  It has been documented that patient is contracting irregularly with no cervical change over 1.5 hours indicating no active labor.  Patient denies any other complaints.  Based on this report provider has given order for discharge.  A discharge order and diagnosis entered by a provider.   Labor discharge instructions reviewed with patient.

## 2022-08-31 NOTE — MAU Note (Signed)
.  Tonya Mclean is a 27 y.o. at [redacted]w[redacted]d here in MAU reporting bloody show since this am. D/C has been bloody today until recently it was brown. Does not think her water is broken but states her water did not break with her first baby. States baby was not moving as much earlier but tonight is moving like normal. Last wk she was 1cm  Onset of complaint: this am Pain score: 3 Vitals:   08/31/22 2048 08/31/22 2051  BP:  123/69  Pulse: 80   Resp: 18   Temp: 97.9 F (36.6 C)   SpO2: 99%      FHT:138 Lab orders placed from triage:  mau labor eval

## 2022-08-31 NOTE — MAU Provider Note (Signed)
S: Ms. Tonya Mclean is a 27 y.o. G2P1001 at [redacted]w[redacted]d  who presents to MAU today for labor evaluation.     Cervical exam by RN:  Dilation: 3 Effacement (%): 70 Cervical Position: Middle Station: -2 Presentation: Vertex Exam by:: Ginnie Smart RN  No change after over one hour  Fetal Monitoring: Baseline: 145 Variability: average Accelerations: present Decelerations: absent Contractions: Irregular  MDM Discussed patient with RN. NST reviewed.   A: SIUP at [redacted]w[redacted]d  False labor  P: Discharge home Labor precautions and kick counts included in AVS Patient to follow-up with Office as scheduled  Patient may return to MAU as needed or when in labor   Valora Piccolo 08/31/2022 10:49 PM

## 2022-09-01 ENCOUNTER — Other Ambulatory Visit: Payer: Self-pay

## 2022-09-01 ENCOUNTER — Inpatient Hospital Stay (HOSPITAL_COMMUNITY): Payer: Medicaid Other | Admitting: Anesthesiology

## 2022-09-01 ENCOUNTER — Inpatient Hospital Stay (HOSPITAL_COMMUNITY)
Admission: AD | Admit: 2022-09-01 | Discharge: 2022-09-03 | DRG: 806 | Disposition: A | Discharge status: Home / Self Care | Payer: Medicaid Other | Attending: Obstetrics and Gynecology | Admitting: Obstetrics and Gynecology

## 2022-09-01 ENCOUNTER — Encounter (HOSPITAL_COMMUNITY): Payer: Self-pay | Admitting: Obstetrics and Gynecology

## 2022-09-01 DIAGNOSIS — O26893 Other specified pregnancy related conditions, third trimester: Secondary | ICD-10-CM | POA: Diagnosis present

## 2022-09-01 DIAGNOSIS — O4292 Full-term premature rupture of membranes, unspecified as to length of time between rupture and onset of labor: Secondary | ICD-10-CM | POA: Diagnosis present

## 2022-09-01 DIAGNOSIS — O99824 Streptococcus B carrier state complicating childbirth: Secondary | ICD-10-CM | POA: Diagnosis present

## 2022-09-01 DIAGNOSIS — Z3A38 38 weeks gestation of pregnancy: Secondary | ICD-10-CM

## 2022-09-01 DIAGNOSIS — O471 False labor at or after 37 completed weeks of gestation: Secondary | ICD-10-CM | POA: Diagnosis present

## 2022-09-01 HISTORY — DX: Gestational (pregnancy-induced) hypertension without significant proteinuria, unspecified trimester: O13.9

## 2022-09-01 LAB — POCT FERN TEST: POCT Fern Test: NEGATIVE

## 2022-09-01 LAB — CBC
HCT: 34.6 % — ABNORMAL LOW (ref 36.0–46.0)
Hemoglobin: 11.9 g/dL — ABNORMAL LOW (ref 12.0–15.0)
MCH: 29.2 pg (ref 26.0–34.0)
MCHC: 34.4 g/dL (ref 30.0–36.0)
MCV: 84.8 fL (ref 80.0–100.0)
Platelets: 206 10*3/uL (ref 150–400)
RBC: 4.08 MIL/uL (ref 3.87–5.11)
RDW: 13.1 % (ref 11.5–15.5)
WBC: 7.2 10*3/uL (ref 4.0–10.5)
nRBC: 0 % (ref 0.0–0.2)

## 2022-09-01 LAB — RUPTURE OF MEMBRANE (ROM)PLUS: Rom Plus: POSITIVE

## 2022-09-01 LAB — TYPE AND SCREEN
ABO/RH(D): O POS
Antibody Screen: NEGATIVE

## 2022-09-01 MED ORDER — ACETAMINOPHEN 325 MG PO TABS: 650.0000 mg | ORAL_TABLET | ORAL | Status: DC | PRN

## 2022-09-01 MED ORDER — ONDANSETRON HCL 4 MG/2ML IJ SOLN: 4.0000 mg | Freq: Four times a day (QID) | INTRAMUSCULAR | Status: DC | PRN

## 2022-09-01 MED ORDER — TERBUTALINE SULFATE 1 MG/ML IJ SOLN: 0.2500 mg | Freq: Once | INTRAMUSCULAR | Status: DC | PRN

## 2022-09-01 MED ORDER — LACTATED RINGERS IV SOLN: 500.0000 mL | INTRAVENOUS | Status: DC | PRN

## 2022-09-01 MED ORDER — OXYCODONE-ACETAMINOPHEN 5-325 MG PO TABS: 2.0000 | ORAL_TABLET | ORAL | Status: DC | PRN

## 2022-09-01 MED ORDER — EPHEDRINE 5 MG/ML INJ: 10.0000 mg | INTRAVENOUS | Status: DC | PRN

## 2022-09-01 MED ORDER — LACTATED RINGERS IV SOLN: INTRAVENOUS | Status: DC

## 2022-09-01 MED ORDER — OXYTOCIN-SODIUM CHLORIDE 30-0.9 UT/500ML-% IV SOLN
1.0000 m[IU]/min | INTRAVENOUS | Status: DC
  Administered 2022-09-01: 2 m[IU]/min via INTRAVENOUS
  Filled 2022-09-01: qty 500

## 2022-09-01 MED ORDER — FLEET ENEMA 7-19 GM/118ML RE ENEM: 1.0000 | ENEMA | RECTAL | Status: DC | PRN

## 2022-09-01 MED ORDER — PHENYLEPHRINE 80 MCG/ML (10ML) SYRINGE FOR IV PUSH (FOR BLOOD PRESSURE SUPPORT): 80.0000 ug | PREFILLED_SYRINGE | INTRAVENOUS | Status: DC | PRN

## 2022-09-01 MED ORDER — OXYCODONE-ACETAMINOPHEN 5-325 MG PO TABS: 1.0000 | ORAL_TABLET | ORAL | Status: DC | PRN

## 2022-09-01 MED ORDER — LIDOCAINE HCL (PF) 1 % IJ SOLN
INTRAMUSCULAR | Status: DC | PRN
  Administered 2022-09-01: 3 mL via EPIDURAL
  Administered 2022-09-01: 5 mL via EPIDURAL

## 2022-09-01 MED ORDER — SOD CITRATE-CITRIC ACID 500-334 MG/5ML PO SOLN: 30.0000 mL | ORAL | Status: DC | PRN

## 2022-09-01 MED ORDER — LACTATED RINGERS IV SOLN
500.0000 mL | Freq: Once | INTRAVENOUS | Status: AC
  Administered 2022-09-01: 500 mL via INTRAVENOUS

## 2022-09-01 MED ORDER — OXYTOCIN BOLUS FROM INFUSION
333.0000 mL | Freq: Once | INTRAVENOUS | Status: AC
  Administered 2022-09-01: 333 mL via INTRAVENOUS

## 2022-09-01 MED ORDER — LIDOCAINE HCL (PF) 1 % IJ SOLN: 30.0000 mL | INTRAMUSCULAR | Status: DC | PRN

## 2022-09-01 MED ORDER — SODIUM CHLORIDE 0.9 % IV SOLN
5.0000 10*6.[IU] | Freq: Once | INTRAVENOUS | Status: AC
  Administered 2022-09-01: 5 10*6.[IU] via INTRAVENOUS
  Filled 2022-09-01: qty 5

## 2022-09-01 MED ORDER — FENTANYL-BUPIVACAINE-NACL 0.5-0.125-0.9 MG/250ML-% EP SOLN
12.0000 mL/h | EPIDURAL | Status: DC | PRN
  Administered 2022-09-01: 12 mL/h via EPIDURAL
  Filled 2022-09-01: qty 250

## 2022-09-01 MED ORDER — PENICILLIN G POT IN DEXTROSE 60000 UNIT/ML IV SOLN
3.0000 10*6.[IU] | INTRAVENOUS | Status: DC
  Administered 2022-09-01 (×2): 3 10*6.[IU] via INTRAVENOUS
  Filled 2022-09-01 (×3): qty 50

## 2022-09-01 MED ORDER — FAMOTIDINE 20 MG PO TABS
20.0000 mg | ORAL_TABLET | Freq: Every day | ORAL | Status: DC
  Administered 2022-09-01: 20 mg via ORAL
  Filled 2022-09-01: qty 1

## 2022-09-01 MED ORDER — OXYTOCIN-SODIUM CHLORIDE 30-0.9 UT/500ML-% IV SOLN
2.5000 [IU]/h | INTRAVENOUS | Status: DC
  Administered 2022-09-01: 2.5 [IU]/h via INTRAVENOUS

## 2022-09-01 MED ORDER — DIPHENHYDRAMINE HCL 50 MG/ML IJ SOLN: 12.5000 mg | INTRAMUSCULAR | Status: DC | PRN

## 2022-09-01 NOTE — Anesthesia Preprocedure Evaluation (Signed)
Anesthesia Evaluation  Patient identified by MRN, date of birth, ID band Patient awake    Reviewed: Allergy & Precautions, NPO status , Patient's Chart, lab work & pertinent test results  History of Anesthesia Complications Negative for: history of anesthetic complications  Airway Mallampati: III  TM Distance: >3 FB Neck ROM: Full    Dental   Pulmonary neg pulmonary ROS   Pulmonary exam normal breath sounds clear to auscultation       Cardiovascular negative cardio ROS  Rhythm:Regular Rate:Normal     Neuro/Psych negative neurological ROS     GI/Hepatic Neg liver ROS,GERD  Medicated,,  Endo/Other  negative endocrine ROS    Renal/GU negative Renal ROS     Musculoskeletal   Abdominal   Peds  Hematology negative hematology ROS (+)   Anesthesia Other Findings Takes baby aspirin  Reproductive/Obstetrics (+) Pregnancy                             Anesthesia Physical Anesthesia Plan  ASA: 2  Anesthesia Plan: Epidural   Post-op Pain Management:    Induction:   PONV Risk Score and Plan:   Airway Management Planned:   Additional Equipment:   Intra-op Plan:   Post-operative Plan:   Informed Consent: I have reviewed the patients History and Physical, chart, labs and discussed the procedure including the risks, benefits and alternatives for the proposed anesthesia with the patient or authorized representative who has indicated his/her understanding and acceptance.       Plan Discussed with: Anesthesiologist  Anesthesia Plan Comments: (I have discussed risks of neuraxial anesthesia including but not limited to infection, bleeding, nerve injury, back pain, headache, seizures, and failure of block. Patient denies bleeding disorders and is not currently anticoagulated. Labs have been reviewed. Risks and benefits discussed. All patient's questions answered.  )       Anesthesia Quick  Evaluation

## 2022-09-01 NOTE — MAU Note (Signed)
Tonya Mclean is a 27 y.o. at [redacted]w[redacted]d here in MAU reporting: around 0430, felt a pop and had a small gush of clear fluids, still feeling some wetness.  Has had contractions, not so bad as before. No bleeding, was here last night, was 3 cm.   Wasn't moving as much earlier, but has started now.   Onset of complaint: 0430 Pain score: 2 Vitals:   09/01/22 1101  BP: 124/70  Pulse: 91  Resp: 16  Temp: 97.7 F (36.5 C)  SpO2: 98%    148 Lab orders placed from triage:  fern

## 2022-09-01 NOTE — H&P (Signed)
Tonya Mclean is a 27 y.o. female, G2 P1001, EGA 38+ weeks with EDC 7-21 presenting for leaking fluid since 0430, occ ctx.  On eval in MAU, fern neg, but ROM plus positive.  PNC essentially uncomplicated, had preeclampsia with first pregnancy, taking baby ASA.  OB History     Gravida  2   Para  1   Term  1   Preterm      AB      Living  1      SAB      IAB      Ectopic      Multiple  0   Live Births  1          Past Medical History:  Diagnosis Date   Pregnancy induced hypertension    elevated during first labor   Past Surgical History:  Procedure Laterality Date   WISDOM TOOTH EXTRACTION     Family History: family history includes Healthy in her father and mother. Social History:  reports that she has never smoked. She has never used smokeless tobacco. She reports that she does not currently use alcohol. She reports that she does not use drugs.     Maternal Diabetes: No Genetic Screening: Normal Maternal Ultrasounds/Referrals: Normal Fetal Ultrasounds or other Referrals:  None Maternal Substance Abuse:  No Significant Maternal Medications:  None Significant Maternal Lab Results:  Group B Strep positive Number of Prenatal Visits:greater than 3 verified prenatal visits Other Comments:  None  Review of Systems  Respiratory: Negative.    Cardiovascular: Negative.    Maternal Medical History:  Reason for admission: Rupture of membranes and contractions.   Contractions: Frequency: irregular.   Perceived severity is mild.   Fetal activity: Perceived fetal activity is normal.   Prenatal complications: no prenatal complications Prenatal Complications - Diabetes: none.   Dilation: 4 Effacement (%): 50 Station: -2 Exam by:: lee Blood pressure 121/74, pulse 87, temperature 97.7 F (36.5 C), temperature source Oral, resp. rate 16, height 5\' 3"  (1.6 m), weight 82.2 kg, last menstrual period 12/04/2021, SpO2 97%, unknown if currently  breastfeeding. Maternal Exam:  Uterine Assessment: Contraction strength is mild.  Contraction frequency is irregular.  Abdomen: Patient reports no abdominal tenderness. Estimated fetal weight is 7 lbs.   Fetal presentation: vertex Introitus: Normal vulva. Normal vagina.  Ferning test: negative.  Amniotic fluid character: clear. Pelvis: adequate for delivery.     Fetal Exam Fetal Monitor Review: Mode: ultrasound.   Baseline rate: 130.  Variability: moderate (6-25 bpm).   Pattern: accelerations present and no decelerations.   Fetal State Assessment: Category I - tracings are normal.   Physical Exam Vitals reviewed.  Constitutional:      Appearance: Normal appearance.  Cardiovascular:     Rate and Rhythm: Normal rate and regular rhythm.  Pulmonary:     Effort: Pulmonary effort is normal. No respiratory distress.  Abdominal:     Palpations: Abdomen is soft.  Genitourinary:    General: Normal vulva.  Neurological:     Mental Status: She is alert.     Prenatal labs: ABO, Rh: --/--/O POS (07/12 1418) Antibody: NEG (07/12 1418) Rubella:  immune RPR:   NR HBsAg:  neg  HIV: NR   GBS: pos    Assessment/Plan: IUP at 38+ weeks with PROM, GBS pos, h/o preeclampsia.  BP normal so far.  Just started pitocin, on PCN, monitor progress, anticipate SVD   Zenaida Niece 09/01/2022, 6:19 PM

## 2022-09-01 NOTE — Anesthesia Procedure Notes (Signed)
Epidural Patient location during procedure: OB Start time: 09/01/2022 9:55 PM End time: 09/01/2022 10:00 PM  Staffing Anesthesiologist: Linton Rump, MD Performed: anesthesiologist   Preanesthetic Checklist Completed: patient identified, IV checked, site marked, risks and benefits discussed, surgical consent, monitors and equipment checked, pre-op evaluation and timeout performed  Epidural Patient position: sitting Prep: DuraPrep and site prepped and draped Patient monitoring: continuous pulse ox and blood pressure Approach: midline Location: L3-L4 Injection technique: LOR saline  Needle:  Needle type: Tuohy  Needle gauge: 17 G Needle length: 9 cm and 9 Needle insertion depth: 6 cm Catheter type: closed end flexible Catheter size: 19 Gauge Catheter at skin depth: 10 cm Test dose: negative  Assessment Events: blood not aspirated, no cerebrospinal fluid, injection not painful, no injection resistance, no paresthesia and negative IV test  Additional Notes The patient has requested an epidural for labor pain management. Risks and benefits including, but not limited to, infection, bleeding, local anesthetic toxicity, headache, hypotension, back pain, block failure, etc. were discussed with the patient. The patient expressed understanding and consented to the procedure. I confirmed that the patient has no bleeding disorders and is not taking blood thinners. I confirmed the patient's last platelet count with the nurse. A time-out was performed immediately prior to the procedure. Please see nursing documentation for vital signs. Sterile technique was used throughout the whole procedure. Once LOR achieved, the epidural catheter threaded easily without resistance. Aspiration of the catheter was negative for blood and CSF. The epidural was dosed slowly and an infusion was started.  1 attempt(s)Reason for block:procedure for pain

## 2022-09-02 ENCOUNTER — Encounter (HOSPITAL_COMMUNITY): Payer: Self-pay | Admitting: Obstetrics and Gynecology

## 2022-09-02 LAB — RPR: RPR Ser Ql: NONREACTIVE

## 2022-09-02 MED ORDER — OXYCODONE HCL 5 MG PO TABS: 5.0000 mg | ORAL_TABLET | ORAL | Status: DC | PRN

## 2022-09-02 MED ORDER — ZOLPIDEM TARTRATE 5 MG PO TABS: 5.0000 mg | ORAL_TABLET | Freq: Every evening | ORAL | Status: DC | PRN

## 2022-09-02 MED ORDER — PRENATAL MULTIVITAMIN CH
1.0000 | ORAL_TABLET | Freq: Every day | ORAL | Status: DC
  Administered 2022-09-02: 1 via ORAL
  Filled 2022-09-02: qty 1

## 2022-09-02 MED ORDER — MAGNESIUM HYDROXIDE 400 MG/5ML PO SUSP: 30.0000 mL | ORAL | Status: DC | PRN

## 2022-09-02 MED ORDER — FAMOTIDINE 20 MG PO TABS
20.0000 mg | ORAL_TABLET | Freq: Two times a day (BID) | ORAL | Status: DC
  Administered 2022-09-02 (×2): 20 mg via ORAL
  Filled 2022-09-02 (×2): qty 1

## 2022-09-02 MED ORDER — METHYLERGONOVINE MALEATE 0.2 MG/ML IJ SOLN: 0.2000 mg | INTRAMUSCULAR | Status: DC | PRN

## 2022-09-02 MED ORDER — ONDANSETRON HCL 4 MG PO TABS: 4.0000 mg | ORAL_TABLET | ORAL | Status: DC | PRN

## 2022-09-02 MED ORDER — SIMETHICONE 80 MG PO CHEW: 80.0000 mg | CHEWABLE_TABLET | ORAL | Status: DC | PRN

## 2022-09-02 MED ORDER — IBUPROFEN 600 MG PO TABS
600.0000 mg | ORAL_TABLET | Freq: Four times a day (QID) | ORAL | Status: DC
  Administered 2022-09-02 – 2022-09-03 (×5): 600 mg via ORAL
  Filled 2022-09-02 (×5): qty 1

## 2022-09-02 MED ORDER — OXYCODONE HCL 5 MG PO TABS: 10.0000 mg | ORAL_TABLET | ORAL | Status: DC | PRN

## 2022-09-02 MED ORDER — MEASLES, MUMPS & RUBELLA VAC IJ SOLR: 0.5000 mL | Freq: Once | INTRAMUSCULAR | Status: DC

## 2022-09-02 MED ORDER — TETANUS-DIPHTH-ACELL PERTUSSIS 5-2.5-18.5 LF-MCG/0.5 IM SUSY: 0.5000 mL | PREFILLED_SYRINGE | Freq: Once | INTRAMUSCULAR | Status: DC

## 2022-09-02 MED ORDER — ONDANSETRON HCL 4 MG/2ML IJ SOLN: 4.0000 mg | INTRAMUSCULAR | Status: DC | PRN

## 2022-09-02 MED ORDER — WITCH HAZEL-GLYCERIN EX PADS: 1.0000 | MEDICATED_PAD | CUTANEOUS | Status: DC | PRN

## 2022-09-02 MED ORDER — BENZOCAINE-MENTHOL 20-0.5 % EX AERO
1.0000 | INHALATION_SPRAY | CUTANEOUS | Status: DC | PRN
  Administered 2022-09-02: 1 via TOPICAL
  Filled 2022-09-02: qty 56

## 2022-09-02 MED ORDER — ACETAMINOPHEN 325 MG PO TABS: 650.0000 mg | ORAL_TABLET | ORAL | Status: DC | PRN

## 2022-09-02 MED ORDER — DIBUCAINE (PERIANAL) 1 % EX OINT: 1.0000 | TOPICAL_OINTMENT | CUTANEOUS | Status: DC | PRN

## 2022-09-02 MED ORDER — DIPHENHYDRAMINE HCL 25 MG PO CAPS: 25.0000 mg | ORAL_CAPSULE | Freq: Four times a day (QID) | ORAL | Status: DC | PRN

## 2022-09-02 MED ORDER — COCONUT OIL OIL
1.0000 | TOPICAL_OIL | Status: DC | PRN
  Administered 2022-09-02: 1 via TOPICAL

## 2022-09-02 MED ORDER — SENNOSIDES-DOCUSATE SODIUM 8.6-50 MG PO TABS: 2.0000 | ORAL_TABLET | Freq: Every day | ORAL | Status: DC

## 2022-09-02 MED ORDER — METHYLERGONOVINE MALEATE 0.2 MG PO TABS: 0.2000 mg | ORAL_TABLET | ORAL | Status: DC | PRN

## 2022-09-02 NOTE — Progress Notes (Signed)
Patient is eating, ambulating, voiding.  Pain control is good.  Appropriate lochia, no complaints.  Vitals:   09/02/22 0100 09/02/22 0143 09/02/22 0314 09/02/22 0751  BP: 133/88 139/89 124/75 110/72  Pulse: 89 81 82 78  Resp:  16 16 16   Temp:  98.2 F (36.8 C) 98.5 F (36.9 C) 98 F (36.7 C)  TempSrc:  Oral Oral Oral  SpO2:  100% 96%   Weight:      Height:        Fundus firm Abd: soft, nontender Ext: no calf tenderness  Lab Results  Component Value Date   WBC 7.2 09/01/2022   HGB 11.9 (L) 09/01/2022   HCT 34.6 (L) 09/01/2022   MCV 84.8 09/01/2022   PLT 206 09/01/2022    --/--/O POS (07/12 1418)  A/P Post partum day 1. Doing well  Routine care.  Expect d/c 7/14.    Tonya Mclean

## 2022-09-02 NOTE — Anesthesia Postprocedure Evaluation (Signed)
Anesthesia Post Note  Patient: Tonya Mclean  Procedure(s) Performed: AN AD HOC LABOR EPIDURAL     Patient location during evaluation: Mother Baby Anesthesia Type: Epidural Level of consciousness: awake and alert and oriented Pain management: satisfactory to patient Vital Signs Assessment: post-procedure vital signs reviewed and stable Respiratory status: respiratory function stable Cardiovascular status: stable Postop Assessment: no headache, no backache, epidural receding, patient able to bend at knees, no signs of nausea or vomiting, adequate PO intake and able to ambulate Anesthetic complications: no   No notable events documented.  Last Vitals:  Vitals:   09/02/22 0314 09/02/22 0751  BP: 124/75 110/72  Pulse: 82 78  Resp: 16 16  Temp: 36.9 C 36.7 C  SpO2: 96%     Last Pain:  Vitals:   09/02/22 0751  TempSrc: Oral  PainSc: 0-No pain   Pain Goal:                   [name]

## 2022-09-02 NOTE — Lactation Note (Signed)
This note was copied from a baby's chart. Lactation Consultation Note  Patient Name: Tonya Mclean [id number] Date: 09/02/2022 Age:25 hours Reason for consult: Initial assessment;Early term 37-38.6wks  LC in to room for initial consult. Lactating parent (LP) states she is experienced. Her first child had difficulty latching but she pumped for 8 months. Parent is comfortable expressing breast milk, declined hand expression demonstration. Reviewed normal newborn behavior during first 24h, expected output and feeding frequency.  Provided a manual pump. Parent will call if she needs help.   Plan: 1-Skin to skin, aim for a deep, comfortable latch and breastfeed on demand or 8-12 times in 24h period. 2-Encouraged maternal rest, hydration and food intake.  3-Contact LC as needed for feeds/support/concerns/questions   All questions answered at this time. Provided Lactation services brochure and other local resources.     Maternal Data Has patient been taught Hand Expression?: No Does the patient have breastfeeding experience prior to this delivery?: Yes How long did the patient breastfeed?: 8 months  Feeding Mother's Current Feeding Choice: Breast Milk  LATCH Score No latch observed during this encounter.   Lactation Tools Discussed/Used Tools: Pump Breast pump type: Manual Pump Education: Setup, frequency, and cleaning;Milk Storage Reason for Pumping: stimulation and supplementation Pumping frequency: as needed  Interventions Interventions: Breast feeding basics reviewed;Skin to skin;Expressed milk;Hand pump;Education;LC Services brochure  Discharge Pump: Personal;Hands Free;DEBP;Manual (motiff) WIC Program: Yes  Consult Status Consult Status: PRN (per parent request)    Tyrick Dunagan A Higuera Ancidey 09/02/2022, 10:09 AM

## 2022-09-03 LAB — CBC
HCT: 32.9 % — ABNORMAL LOW (ref 36.0–46.0)
Hemoglobin: 11.1 g/dL — ABNORMAL LOW (ref 12.0–15.0)
MCH: 29.1 pg (ref 26.0–34.0)
MCHC: 33.7 g/dL (ref 30.0–36.0)
MCV: 86.1 fL (ref 80.0–100.0)
Platelets: 168 10*3/uL (ref 150–400)
RBC: 3.82 MIL/uL — ABNORMAL LOW (ref 3.87–5.11)
RDW: 13.4 % (ref 11.5–15.5)
WBC: 9.1 10*3/uL (ref 4.0–10.5)
nRBC: 0 % (ref 0.0–0.2)

## 2022-09-03 NOTE — Discharge Summary (Signed)
Postpartum Discharge Summary     Patient Name: Tonya Mclean DOB: [birth year] MRN: [id number]  Date of admission: 09/01/2022 Delivery date:09/01/2022 Delivering provider: Jackelyn Knife, TODD Date of discharge: 09/03/2022  Admitting diagnosis: Normal labor [O80, Z37.9] PROM Intrauterine pregnancy: [redacted]w[redacted]d     Secondary diagnosis:  Principal Problem:   Normal labor  Additional problems: none    Discharge diagnosis: Term Pregnancy Delivered                                              Post partum procedures: none Augmentation: Pitocin Complications: None  Hospital course: Onset of Labor With Vaginal Delivery      27 y.o. yo [id number] at [redacted]w[redacted]d was admitted in Active Labor on 09/01/2022. Labor course was complicated bynothing  Membrane Rupture Time/Date: 4:00 AM,09/01/2022  Delivery Method:Vaginal, Spontaneous Episiotomy: None Lacerations:  1st degree;Labial;Perineal Patient had a postpartum course complicated by nothing.  She is ambulating, tolerating a regular diet, passing flatus, and urinating well. Patient is discharged home in stable condition on 09/03/22.  Newborn Data: Birth date:09/01/2022 Birth time:11:23 PM Gender:Female Living status:Living Apgars:9 ,9  Weight:3310 g  Magnesium Sulfate received: No BMZ received: No Rhophylac:N/A Transfusion:No  Physical exam  Vitals:   09/02/22 1200 09/02/22 1533 09/02/22 2041 09/03/22 0510  BP: 129/75 109/61 117/68 124/66  Pulse: 78 79 81 81  Resp: 16 17 18 16   Temp: 97.8 F (36.6 C) 97.8 F (36.6 C) 98.8 F (37.1 C)   TempSrc: Oral  Oral   SpO2:  100% 98% 98%  Weight:      Height:       General: alert, cooperative, and no distress Lochia: appropriate Uterine Fundus: firm Incision: N/A DVT Evaluation: No evidence of DVT seen on physical exam. Labs: Lab Results  Component Value Date   WBC 7.2 09/01/2022   HGB 11.9 (L) 09/01/2022   HCT 34.6 (L) 09/01/2022   MCV 84.8 09/01/2022   PLT 206 09/01/2022      Latest Ref  Rng & Units 07/16/2022   11:22 PM  CMP  Glucose 70 - 99 mg/dL 89   BUN 6 - 20 mg/dL 8   Creatinine 2.95 - 2.84 mg/dL 1.32   Sodium 440 - 102 mmol/L 134   Potassium 3.5 - 5.1 mmol/L 3.3   Chloride 98 - 111 mmol/L 105   CO2 22 - 32 mmol/L 19   Calcium 8.9 - 10.3 mg/dL 8.3   Total Protein 6.5 - 8.1 g/dL 5.8   Total Bilirubin 0.3 - 1.2 mg/dL 0.3   Alkaline Phos 38 - 126 U/L 101   AST 15 - 41 U/L 16   ALT 0 - 44 U/L 12    Edinburgh Score:    09/02/2022    3:14 AM  Edinburgh Postnatal Depression Scale Screening Tool  I have been able to laugh and see the funny side of things. 0  I have looked forward with enjoyment to things. 0  I have blamed myself unnecessarily when things went wrong. 0  I have been anxious or worried for no good reason. 0  I have felt scared or panicky for no good reason. 0  Things have been getting on top of me. 0  I have been so unhappy that I have had difficulty sleeping. 0  I have felt sad or miserable. 0  I have been so unhappy  that I have been crying. 0  The thought of harming myself has occurred to me. 0  Edinburgh Postnatal Depression Scale Total 0      After visit meds:  Allergies as of 09/03/2022   No Known Allergies      Medication List     STOP taking these medications    aspirin 81 MG chewable tablet   famotidine 20 MG tablet Commonly known as: PEPCID   ondansetron 8 MG disintegrating tablet Commonly known as: ZOFRAN-ODT   promethazine 12.5 MG tablet Commonly known as: PHENERGAN   scopolamine 1 MG/3DAYS Commonly known as: Transderm-Scop       TAKE these medications    multivitamin-prenatal 27-0.8 MG Tabs tablet Take 1 tablet by mouth daily at 12 noon.         Discharge home in stable condition  Infant Disposition:home with mother Discharge instruction: per After Visit Summary and Postpartum booklet. Activity: Advance as tolerated. Pelvic rest for 6 weeks.  Diet: routine diet Anticipated Birth Control:  Unsure Postpartum Appointment:4 weeks Additional Postpartum F/U: Postpartum Depression checkup Future Appointments:No future appointments. Follow up Visit:  Follow-up Information     Ob/Gyn, Nestor Ramp. Call in 4 week(s).   Contact information: [address] [place] [address] [phone]                     09/03/2022 Philip Aspen, DO

## 2022-09-03 NOTE — Plan of Care (Signed)
complete

## 2022-09-17 ENCOUNTER — Inpatient Hospital Stay (HOSPITAL_COMMUNITY): Admission: AD | Admit: 2022-09-17 | Payer: Medicaid Other | Source: Home / Self Care | Admitting: Obstetrics

## 2022-09-17 ENCOUNTER — Inpatient Hospital Stay (HOSPITAL_COMMUNITY): Payer: Medicaid Other

## 2022-09-28 ENCOUNTER — Telehealth (HOSPITAL_COMMUNITY): Payer: Self-pay | Admitting: *Deleted

## 2022-09-28 NOTE — Telephone Encounter (Signed)
09/28/2022  Name: Tonya Mclean MRN: [id number] DOB: May 01, 1995  Reason for Call:  Transition of Care Hospital Discharge Call  Contact Status: Patient Contact Status: Complete  Language assistant needed: Interpreter Mode: Interpreter Not Needed        Follow-Up Questions: Do You Have Any Concerns About Your Health As You Heal From Delivery?: No Do You Have Any Concerns About Your Infants Health?: No  Edinburgh Postnatal Depression Scale:  In the Past 7 Days: I have been able to laugh and see the funny side of things.: As much as I always could I have looked forward with enjoyment to things.: As much as I ever did I have blamed myself unnecessarily when things went wrong.: No, never I have been anxious or worried for no good reason.: No, not at all I have felt scared or panicky for no good reason.: No, not at all Things have been getting on top of me.: No, I have been coping as well as ever I have been so unhappy that I have had difficulty sleeping.: Not at all I have felt sad or miserable.: No, not at all I have been so unhappy that I have been crying.: No, never The thought of harming myself has occurred to me.: Never Inocente Salles Postnatal Depression Scale Total: 0  PHQ2-9 Depression Scale:     Discharge Follow-up: Edinburgh score requires follow up?: No Patient was advised of the following resources:: Breastfeeding Support Group, Support Group Did patient express any COVID concerns?: No  Post-discharge interventions: Reviewed Newborn Safe Sleep Practices  Salena Saner, RN 09/28/2022 14:39

## 2023-05-21 ENCOUNTER — Encounter: Payer: Self-pay | Admitting: Dermatology

## 2023-05-21 ENCOUNTER — Ambulatory Visit (INDEPENDENT_AMBULATORY_CARE_PROVIDER_SITE_OTHER): Admitting: Dermatology

## 2023-05-21 VITALS — BP 111/72

## 2023-05-21 DIAGNOSIS — L249 Irritant contact dermatitis, unspecified cause: Secondary | ICD-10-CM | POA: Diagnosis not present

## 2023-05-21 MED ORDER — HYDROCORTISONE 2.5 % EX CREA
TOPICAL_CREAM | Freq: Two times a day (BID) | CUTANEOUS | 2 refills | Status: AC | PRN
Start: 2023-05-21 — End: ?

## 2023-05-21 NOTE — Progress Notes (Signed)
   New Patient Visit   Subjective  Tonya Mclean is a 28 y.o. female who presents for the following: New Pt - Rash  Patient states she has rash located at the under eye that she would like to have examined. Patient reports the areas have been there for 5 months. She reports the areas are bothersome.Patient rates irritation 5 out of 10. She states that the areas have not spread. Patient reports she has not previously been treated for these areas. Pt stated that she has been applying OTC Aquaphor to the area. Patient denied Hx of bx. Patient denied family history of skin cancer(s).   The following portions of the chart were reviewed this encounter and updated as appropriate: medications, allergies, medical history  Review of Systems:  No other skin or systemic complaints except as noted in HPI or Assessment and Plan.  Objective  Well appearing patient in no apparent distress; mood and affect are within normal limits.   A focused examination was performed of the following areas: face   Relevant exam findings are noted in the Assessment and Plan.           Assessment & Plan   IRRITANT CONTACT DERMATITIS Exam: Scaly pink plaques B/L eyelid and corner of right eye  - Assessment: Pink scaly plaques on bilateral lower eyelids and upper eyelid corners. 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 Potential causes include new sensitivity to long-used product or reaction to recently introduced item. Panoxyl (benzoyl peroxide) face wash suspected as possible irritant. Patient reports using Vinoxym for over a year and apple oil as moisturizer. No recent changes in makeup, hair products, or eye drops reported.  - Plan:    Discontinue use of Panoxyl face wash    Switch to Avene tolerance line face wash and moisturizers (preservative-free)    Prescribe hydrocortisone 2.5% ointment, applied morning and night for up to 10 days     If symptoms resolve in 5 days, discontinue hydrocortisone early    After 10 days or symptom resolution, switch to Aquaphor    Referral to allergist for potential patch testing    Patient instructed to monitor for symptom recurrence and consider recent product use if symptoms return IRRITANT CONTACT DERMATITIS, UNSPECIFIED TRIGGER   Related Procedures Ambulatory referral to Allergy Related Medications hydrocortisone 2.5 % cream Apply topically 2 (two) times daily as needed (Rash). Use for 10 days then stop.  No follow-ups on file.    Documentation: I have reviewed the above documentation for accuracy and completeness, and I agree with the above.   I, Shirron Marcha Solders, CMA, am acting as scribe for [employer], DO.   Langston Reusing, DO

## 2023-05-21 NOTE — Patient Instructions (Addendum)
 Hello Tonya Mclean,  Thank you for visiting today. Here is a summary of the key instructions:  Diagnosis: Irritant Contact Dermatitis   - Skin Care:   - Stop using Panoxyl cleanser   - Use Avene's tolerance line face wash and moisturizers   - Apply Aquaphor after 10 days of steroid treatment  - Medications:   - Use hydrocortisone 2.5% ointment on eyelids     - Apply morning and night for up to 10 days     - Stop early if condition resolves in 5 days  - Referrals:   - Referral to an allergist for patch testing  - Follow-up:   - Return to clinic as needed if symptoms return  - Additional Instructions:   - If symptoms return:     - Use hydrocortisone again     - Think about new products used in the last 4 days   - Watch for delayed reactions to:     - Makeup     - Mascara     - Shampoo  Please reach out if you have any questions or concerns.  Warm regards,  Dr. Langston Reusing, Dermatology         Important Information  Due to recent changes in healthcare laws, you may see results of your pathology and/or laboratory studies on MyChart before the doctors have had a chance to review them. We understand that in some cases there may be results that are confusing or concerning to you. Please understand that not all results are received at the same time and often the doctors may need to interpret multiple results in order to provide you with the best plan of care or course of treatment. Therefore, we ask that you please give [place] 2 business days to thoroughly review all your results before contacting the office for clarification. Should we see a critical lab result, you will be contacted sooner.   If You Need Anything After Your Visit  If you have any questions or concerns for your doctor, please call our main line at [phone] If no one answers, please leave a voicemail as directed and we will return your call as soon as possible. Messages left after 4 pm will be answered the  following business day.   You may also send [place] a message via MyChart. We typically respond to MyChart messages within 1-2 business days.  For prescription refills, please ask your pharmacy to contact our office. Our fax number is [phone].  If you have an urgent issue when the clinic is closed that cannot wait until the next business day, you can page your doctor at the number below.    Please note that while we do our best to be available for urgent issues outside of office hours, we are not available 24/7.   If you have an urgent issue and are unable to reach [place], you may choose to seek medical care at your doctor's office, retail clinic, urgent care center, or emergency room.  If you have a medical emergency, please immediately call 911 or go to the emergency department. In the event of inclement weather, please call our main line at [phone] for an update on the status of any delays or closures.  Dermatology Medication Tips: Please keep the boxes that topical medications come in in order to help keep track of the instructions about where and how to use these. Pharmacies typically print the medication instructions only on the boxes and not directly on the medication  tubes.   If your medication is too expensive, please contact our office at [phone] or send [place] a message through MyChart.   We are unable to tell what your co-pay for medications will be in advance as this is different depending on your insurance coverage. However, we may be able to find a substitute medication at lower cost or fill out paperwork to get insurance to cover a needed medication.   If a prior authorization is required to get your medication covered by your insurance company, please allow [place] 1-2 business days to complete this process.  Drug prices often vary depending on where the prescription is filled and some pharmacies may offer cheaper prices.  The website www.goodrx.com contains coupons for  medications through different pharmacies. The prices here do not account for what the cost may be with help from insurance (it may be cheaper with your insurance), but the website can give you the price if you did not use any insurance.  - You can print the associated coupon and take it with your prescription to the pharmacy.  - You may also stop by our office during regular business hours and pick up a GoodRx coupon card.  - If you need your prescription sent electronically to a different pharmacy, notify our office through [facility] or by phone at [phone]

## 2023-06-13 ENCOUNTER — Encounter: Payer: Self-pay | Admitting: Dermatology

## 2023-06-13 ENCOUNTER — Ambulatory Visit: Admitting: Dermatology

## 2023-06-13 DIAGNOSIS — L249 Irritant contact dermatitis, unspecified cause: Secondary | ICD-10-CM

## 2023-06-13 DIAGNOSIS — H019 Unspecified inflammation of eyelid: Secondary | ICD-10-CM

## 2023-06-13 MED ORDER — TACROLIMUS 0.1 % EX OINT: TOPICAL_OINTMENT | Freq: Every day | CUTANEOUS | 0 refills | Status: DC

## 2023-06-13 NOTE — Patient Instructions (Addendum)
 Hello Oral Billings,  Thank you for visiting today. Here is a summary of the key instructions:  - Medications:   - Use hydrocortisone  ointment for 10 days   - After 10 days, switch to tacrolimus  ointment for 1 month   - You can use tacrolimus  ointment every day if needed  - Skin Care:   - Stop using all eye makeup   - Use warm water only to clean your face   - Avoid using scented products on your hands or body   - When washing hair, keep shampoo and conditioner off your face  - Recommended Products:   - Try SEEN shampoo and conditioner (available online)   - Continue using Beauty Bar for washing   - Continue using Avene Tolerance moisturizer  - Laundry:   - Use unscented laundry detergent   - Do not use fabric softeners  - Follow-up:   - Attend patch testing appointment on April 15   - Schedule a follow-up appointment in 2 months   - Send a message on MyChart if you need help before then  We look forward to seeing you at your next visit. If you have any questions or concerns before then, please do not hesitate to contact our office.  Warm regards,  Dr. Louana Roup Dermatology     Important Information  Due to recent changes in healthcare laws, you may see results of your pathology and/or laboratory studies on MyChart before the doctors have had a chance to review them. We understand that in some cases there may be results that are confusing or concerning to you. Please understand that not all results are received at the same time and often the doctors may need to interpret multiple results in order to provide you with the best plan of care or course of treatment. Therefore, we ask that you please give us  2 business days to thoroughly review all your results before contacting the office for clarification. Should we see a critical lab result, you will be contacted sooner.   If You Need Anything After Your Visit  If you have any questions or concerns for your doctor, please  call our main line at [phone] If no one answers, please leave a voicemail as directed and we will return your call as soon as possible. Messages left after 4 pm will be answered the following business day.   You may also send us  a message via MyChart. We typically respond to MyChart messages within 1-2 business days.  For prescription refills, please ask your pharmacy to contact our office. Our fax number is [phone].  If you have an urgent issue when the clinic is closed that cannot wait until the next business day, you can page your doctor at the number below.    Please note that while we do our best to be available for urgent issues outside of office hours, we are not available 24/7.   If you have an urgent issue and are unable to reach us , you may choose to seek medical care at your doctor's office, retail clinic, urgent care center, or emergency room.  If you have a medical emergency, please immediately call 911 or go to the emergency department. In the event of inclement weather, please call our main line at [phone] for an update on the status of any delays or closures.  Dermatology Medication Tips: Please keep the boxes that topical medications come in in order to help keep track of the instructions about where and  how to use these. Pharmacies typically print the medication instructions only on the boxes and not directly on the medication tubes.   If your medication is too expensive, please contact our office at [phone] or send us  a message through MyChart.   We are unable to tell what your co-pay for medications will be in advance as this is different depending on your insurance coverage. However, we may be able to find a substitute medication at lower cost or fill out paperwork to get insurance to cover a needed medication.   If a prior authorization is required to get your medication covered by your insurance company, please allow us  1-2 business days to complete  this process.  Drug prices often vary depending on where the prescription is filled and some pharmacies may offer cheaper prices.  The website www.goodrx.com contains coupons for medications through different pharmacies. The prices here do not account for what the cost may be with help from insurance (it may be cheaper with your insurance), but the website can give you the price if you did not use any insurance.  - You can print the associated coupon and take it with your prescription to the pharmacy.  - You may also stop by our office during regular business hours and pick up a GoodRx coupon card.  - If you need your prescription sent electronically to a different pharmacy, notify our office through [facility] or by phone at [phone]

## 2023-06-13 NOTE — Progress Notes (Signed)
   Follow-Up Visit   Subjective  Tonya Mclean is a 28 y.o. female who presents for the following: rash  Patient present today for follow up visit for rash. Patient was last evaluated on 05/21/2023. At this visit patient was prescribed hydrocortisone  2.5% ointment . Patient reports sxs are  improved but not at goal . Patient denies medication changes. Patient stated that she thinks the sun is what triggers the rash.  The following portions of the chart were reviewed this encounter and updated as appropriate: medications, allergies, medical history  Review of Systems:  No other skin or systemic complaints except as noted in HPI or Assessment and Plan.  Objective  Well appearing patient in no apparent distress; mood and affect are within normal limits.    A focused examination was performed of the following areas:   Relevant exam findings are noted in the Assessment and Plan.    Assessment & Plan   1. Eyelid Dermatitis - Assessment: Recurrent eyelid dermatitis, initially responsive to hydrocortisone  ointment but relapsing shortly after discontinuation. Examination reveals pink scaly plaques in the medial and lateral canthi of the right eye, with minor involvement of the left eye. Differential diagnoses include contact dermatitis, atopic dermatitis, and less likely, polymorphic light eruption. Patient reports suspicion of sun exposure as a trigger, but this is deemed unlikely given the localized nature of the condition. Recent changes in skincare products and potential allergens have been ruled out through patient history. Seasonal allergies are considered as a possible contributing factor.  - Plan:    Prescribe tacrolimus  ointment for daily use as a steroid-free alternative    Continue hydrocortisone  ointment as needed for 10-day courses    Recommend using only warm water for cleansing the affected area    Advise patient to avoid eye makeup and potential irritants    Suggest  switching to SEEN shampoo and conditioner to minimize potential irritants    Schedule patch testing for further evaluation to identify potential allergens    Schedule follow-up in 2 months to review patch testing results and reassess condition    Instruct patient to message via MyChart if needed before scheduled follow-up  Follow-up in 2 months for reassessment and review of patch testing results.   Return in about 2 months (around 08/13/2023) for rash.  Exie Holler, CMA, am acting as scribe for [employer], DO.   Documentation: I have reviewed the above documentation for accuracy and completeness, and I agree with the above.  Louana Roup, DO

## 2023-07-05 ENCOUNTER — Ambulatory Visit (INDEPENDENT_AMBULATORY_CARE_PROVIDER_SITE_OTHER): Payer: Self-pay | Admitting: Allergy & Immunology

## 2023-07-05 ENCOUNTER — Other Ambulatory Visit: Payer: Self-pay

## 2023-07-05 ENCOUNTER — Encounter: Payer: Self-pay | Admitting: Allergy & Immunology

## 2023-07-05 VITALS — BP 94/60 | HR 84 | Temp 98.3°F | Resp 16 | Ht 63.0 in | Wt 136.8 lb

## 2023-07-05 DIAGNOSIS — J310 Chronic rhinitis: Secondary | ICD-10-CM

## 2023-07-05 DIAGNOSIS — L239 Allergic contact dermatitis, unspecified cause: Secondary | ICD-10-CM

## 2023-07-05 NOTE — Patient Instructions (Addendum)
 1. Allergic contact dermatitis - Add on Opzelura twice daily as needed for the rash around your eyes. - This is NOT a steroid and does not sauce the pigmentation problems. - Sample provided.  - We may consider patch testing is the skin testing is not useful. - This looks for sensitizations to chemicals, cosmetics, etc.   2. Chronic rhinitis - Because of insurance stipulations, we cannot do skin testing on the same day as your first visit. - We are all working to fight this, but for now we need to do two separate visits.  - We will know more after we do testing at the next visit.  - The skin testing visit can be squeezed in at your convenience.  - Then we can make a more full plan to address all of your symptoms. - Be sure to stop your antihistamines for 3 days before this appointment.    3. Return in about 1 week (around 07/12/2023) for SKIN TESTING (1-68). You can have the follow up appointment with Dr. Idolina Maker or a Nurse Practicioner (our Nurse Practitioners are excellent and always have Physician oversight!).    Please inform us  of any Emergency Department visits, hospitalizations, or changes in symptoms. Call us  before going to the ED for breathing or allergy symptoms since we might be able to fit you in for a sick visit. Feel free to contact us  anytime with any questions, problems, or concerns.  It was a pleasure to meet you and your beautiful family today!  Websites that have reliable patient information: 1. American Academy of Asthma, Allergy, and Immunology: www.aaaai.org 2. Food Allergy Research and Education (FARE): foodallergy.org 3. Mothers of Asthmatics: http://www.asthmacommunitynetwork.org 4. American College of Allergy, Asthma, and Immunology: www.acaai.org      "Like" us  on Facebook and Instagram for our latest updates!      A healthy democracy works best when [employer] participate! Make sure you are registered to vote! If you have moved or changed any of your  contact information, you will need to get this updated before voting! Scan the QR codes below to learn more!

## 2023-07-05 NOTE — Progress Notes (Signed)
 NEW PATIENT  Date of Service/Encounter:  07/05/23  Consult requested by: Patient, No Pcp Per   Assessment:   Allergic contact dermatitis  Chronic rhinitis - planning for skin testing at the next visit   Plan/Recommendations:   1. Allergic contact dermatitis - Add on Opzelura twice daily as needed for the rash around your eyes. - This is NOT a steroid and does not sauce the pigmentation problems. - Sample provided.  - We will also plan to pursue patch testing. - This looks for sensitizations to chemicals, cosmetics, etc.   2. Chronic rhinitis - Because of insurance stipulations, we cannot do skin testing on the same day as your first visit. - We are all working to fight this, but for now we need to do two separate visits.  - We will know more after we do testing at the next visit.  - The skin testing visit can be squeezed in at your convenience.  - Then we can make a more full plan to address all of your symptoms. - Be sure to stop your antihistamines for 3 days before this appointment.    3. Return in about 1 week (around 07/12/2023) for SKIN TESTING (1-68). You can have the follow up appointment with Dr. Idolina Maker or a Nurse Practicioner (our Nurse Practitioners are excellent and always have Physician oversight!).   This note in its entirety was forwarded to the Provider who requested this consultation.  Subjective:   Tonya Mclean is a 28 y.o. female presenting today for evaluation of  Chief Complaint  Patient presents with   Allergic Rhinitis     Says the dermatologist sent her here to evaluate what is cause her eye issues.     Tonya Mclean has a history of the following: Patient Active Problem List   Diagnosis Date Noted   Normal labor 09/01/2022   Preeclampsia 11/10/2019    History obtained from: chart review and patient.  Discussed the use of AI scribe software for clinical note transcription with the patient and/or guardian, who gave verbal consent to  proceed.  Tonya Mclean was referred by Patient, No Pcp Per.     Tonya Mclean is a 28 y.o. female presenting for an evaluation of a periorbital rash.  She was referred by Dr. Louana Roup.  Allergic Rhinitis Symptom History: She has a history of seasonal allergies, experiencing sneezing and itchy, watery eyes, particularly in the fall. She takes Claritin, which she finds effective.  She has never been allergy tested in the past. She has no history of sinus infections, ear infections, or pneumonia.   Skin Symptom History: She has a rash around her eyelids that first appeared last year. Initially, it was a small area that resolved but recurred two to three months postpartum, spreading and worsening. The rash is itchy. She has been using hydrocortisone  prescribed by her dermatologist, which has been somewhat effective. She has also tried Aquaphor and over-the-counter hydrocortisone , but not other over-the-counter treatments like calamine lotion. Sun exposure and cleaning the bathroom seem to trigger the rash, but it does not change with food or indoor versus outdoor environments. She does not use cosmetics like mascara.  There are no environmental triggers that are new to the home. She stays home with her two children, aged three years and ten months. No lesions on her arms or legs.  No history of eczema or other skin conditions. Her daughter has eczema.  She has seen Dr. Myrtie Atkinson on 2 occasions.  Her  first visit was March 2025.  Her face wash was discontinued.  She was switched to preservative-free moisturizers and prescribed hydrocortisone  2.5% ointment.  Looks like she was prescribed tacrolimus  at her visit in April.  Patch testing was recommended, but they do not do patch testing at that clinic which is one of the reason she was referred here.  Otherwise, there is no history of other atopic diseases, including asthma, food allergies, drug allergies, stinging insect allergies, or contact dermatitis. There is  no significant infectious history. Vaccinations are up to date.    Past Medical History: Patient Active Problem List   Diagnosis Date Noted   Normal labor 09/01/2022   Preeclampsia 11/10/2019    Medication List:  Allergies as of 07/05/2023   No Known Allergies      Medication List        Accurate as of Jul 05, 2023 11:51 AM. If you have any questions, ask your nurse or doctor.          STOP taking these medications    tacrolimus  0.1 % ointment Commonly known as: PROTOPIC  Stopped by: Rochester Chuck       TAKE these medications    hydrocortisone  2.5 % cream Apply topically 2 (two) times daily as needed (Rash). Use for 10 days then stop.   medroxyPROGESTERone 150 MG/ML injection Commonly known as: DEPO-PROVERA Inject 150 mg into the muscle every 3 (three) months.   multivitamin-prenatal 27-0.8 MG Tabs tablet Take 1 tablet by mouth daily at 12 noon.        Birth History: non-contributory  Developmental History: non-contributory  Past Surgical History: Past Surgical History:  Procedure Laterality Date   WISDOM TOOTH EXTRACTION  2023     Family History: Family History  Problem Relation Age of Onset   Healthy Mother    Healthy Father    Eczema Sister    Healthy Sister    Healthy Sister    Healthy Brother      Social History: Tonya Mclean lives at home with her family.  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  They live in a house that is more than 26 years old.  There is hardwood throughout the home.  They have electric heating and central cooling.  There are no animals inside or outside of the home.  There are no dust mite covers on the bedding.  There is no tobacco exposure.  There is no fume, chemical, or dust exposure.  There is no HEPA filter in the home.  They do not live near an interstate or industrial area.   Review of systems otherwise negative other than that mentioned in the HPI.    Objective:   Blood  pressure 94/60, pulse 84, temperature 98.3 F (36.8 C), temperature source Temporal, resp. rate 16, height 5\' 3"  (1.6 m), weight 136 lb 12.8 oz (62.1 kg), SpO2 98%, unknown if currently breastfeeding. Body mass index is 24.23 kg/m.     Physical Exam Constitutional:      Appearance: She is well-developed.  HENT:     Head: Normocephalic and atraumatic.     Right Ear: Tympanic membrane, ear canal and external ear normal. No drainage, swelling or tenderness. Tympanic membrane is not injected, scarred, erythematous, retracted or bulging.     Left Ear: Tympanic membrane, ear canal and external ear normal. No drainage, swelling or tenderness. Tympanic membrane is not injected, scarred, erythematous, retracted or bulging.     Nose: No  nasal deformity, septal deviation, mucosal edema or rhinorrhea.     Right Sinus: No maxillary sinus tenderness or frontal sinus tenderness.     Left Sinus: No maxillary sinus tenderness or frontal sinus tenderness.     Mouth/Throat:     Mouth: Mucous membranes are not pale and not dry.     Pharynx: Uvula midline.  Eyes:     General: Allergic shiner present.        Right eye: No discharge.        Left eye: No discharge.     Conjunctiva/sclera: Conjunctivae normal.     Right eye: Right conjunctiva is not injected. No chemosis.    Left eye: Left conjunctiva is not injected. No chemosis.    Pupils: Pupils are equal, round, and reactive to light.  Cardiovascular:     Rate and Rhythm: Normal rate and regular rhythm.     Heart sounds: Normal heart sounds.  Pulmonary:     Effort: Pulmonary effort is normal. No tachypnea, accessory muscle usage or respiratory distress.     Breath sounds: Normal breath sounds. No wheezing, rhonchi or rales.  Chest:     Chest wall: No tenderness.  Abdominal:     Tenderness: There is no abdominal tenderness. There is no guarding or rebound.  Lymphadenopathy:     Head:     Right side of head: No submandibular, tonsillar or occipital  adenopathy.     Left side of head: No submandibular, tonsillar or occipital adenopathy.     Cervical: No cervical adenopathy.  Skin:    General: Skin is warm.     Capillary Refill: Capillary refill takes less than 2 seconds.     Coloration: Skin is not pale.     Findings: Rash present. No abrasion, erythema or petechiae. Rash is not papular, urticarial or vesicular.     Comments: She does have some periorbital irritation that is most prominent on the right side.  She does have some hypopigmented skin on the lower periorbital region, extending to her right temple.  Neurological:     Mental Status: She is alert.      Diagnostic studies: deferred due to insurance stipulations that require a separate visit for testing       Drexel Gentles, MD [facility] 

## 2023-07-19 ENCOUNTER — Encounter: Payer: Self-pay | Admitting: Allergy & Immunology

## 2023-07-19 ENCOUNTER — Ambulatory Visit (INDEPENDENT_AMBULATORY_CARE_PROVIDER_SITE_OTHER): Admitting: Allergy & Immunology

## 2023-07-19 DIAGNOSIS — L239 Allergic contact dermatitis, unspecified cause: Secondary | ICD-10-CM

## 2023-07-19 DIAGNOSIS — J3089 Other allergic rhinitis: Secondary | ICD-10-CM

## 2023-07-19 DIAGNOSIS — J302 Other seasonal allergic rhinitis: Secondary | ICD-10-CM

## 2023-07-19 NOTE — Patient Instructions (Addendum)
 1. Allergic contact dermatitis - needs patch testing - Continue with Opzelura twice daily as needed for the rash around your eyes. - This is NOT a steroid and does not sauce the pigmentation problems. - We may consider patch testing is the skin testing is not useful. - This looks for sensitizations to chemicals, cosmetics, etc.  - These are placed on a Monday and then read on a Wednesday and a Friday.   2. Chronic rhinitis - Testing today showed: weeds, trees, indoor molds, and dust mites - Copy of test results provided.  - Avoidance measures provided. - Stop taking: Claritin - Start taking: Xyzal (levocetirizine) 5mg  tablet once daily - You can use an extra dose of the antihistamine, if needed, for breakthrough symptoms.  - Consider nasal saline rinses 1-2 times daily to remove allergens from the nasal cavities as well as help with mucous clearance (this is especially helpful to do before the nasal sprays are given) - We could allergy shots as a means of long-term control. - Allergy shots "re-train" and "reset" the immune system to ignore environmental allergens and decrease the resulting immune response to those allergens (sneezing, itchy watery eyes, runny nose, nasal congestion, etc).    - Allergy shots improve symptoms in 75-85% of patients.  - But I do not think that your symptoms warrant that at this point.   3. Return in about 4 weeks (around 08/16/2023) for [facility] TESTING (NAC-80). You can have the follow up appointment with Dr. Idolina Maker or a Nurse Practicioner (our Nurse Practitioners are excellent and always have Physician oversight!).    Please inform us  of any Emergency Department visits, hospitalizations, or changes in symptoms. Call us  before going to the ED for breathing or allergy symptoms since we might be able to fit you in for a sick visit. Feel free to contact us  anytime with any questions, problems, or concerns.  It was a pleasure to meet you and your beautiful family  today!  Websites that have reliable patient information: 1. American Academy of Asthma, Allergy, and Immunology: www.aaaai.org 2. Food Allergy Research and Education (FARE): foodallergy.org 3. Mothers of Asthmatics: http://www.asthmacommunitynetwork.org 4. American College of Allergy, Asthma, and Immunology: www.acaai.org      "Like" us  on Facebook and Instagram for our latest updates!      A healthy democracy works best when [employer] participate! Make sure you are registered to vote! If you have moved or changed any of your contact information, you will need to get this updated before voting! Scan the QR codes below to learn more!      Airborne Adult Perc - 07/19/23 1101     Time Antigen Placed 1030    Allergen Manufacturer Floyd Hutchinson    Location Back    Number of Test 55    1. Control-Buffer 50% Glycerol Negative    2. Control-Histamine 2+    3. Bahia Negative    4. French Southern Territories Negative    5. Johnson Negative    6. [place]  Blue Negative    7. Meadow Fescue Negative    8. Perennial Rye Negative    9. Timothy Negative    10. Ragweed Mix Negative    11. Cocklebur Negative    12. Plantain,  English 2+    13. Baccharis Negative    14. Dog Fennel Negative    15. Russian Thistle Negative    16. Lamb's Quarters Negative    17. Sheep Sorrell Negative    18. Rough Pigweed Negative  19. Marsh Elder, Rough Negative    20. Mugwort, Common 2+    21. Box, Elder Negative    22. Cedar, red Negative    23. Sweet Gum 3+    24. Pecan Pollen 2+    25. Pine Mix 2+    26. Walnut, Black Pollen Negative    27. Red Mulberry Negative    28. Ash Mix 2+    29. Birch Mix 2+    30. Beech American 2+    31. Cottonwood, Guinea-Bissau Negative    32. Hickory, White Negative    33. Maple Mix Negative    34. Oak, [place] Mix 4+    35. Sycamore Eastern 2+    36. Alternaria Alternata Negative    37. Cladosporium Herbarum Negative    38. Aspergillus Mix Negative    39. Penicillium Mix Negative     40. Bipolaris Sorokiniana (Helminthosporium) Negative    41. Drechslera Spicifera (Curvularia) Negative    42. Mucor Plumbeus Negative    43. Fusarium Moniliforme 3+    44. Aureobasidium Pullulans (pullulara) Negative    45. Rhizopus Oryzae 3+    46. Botrytis Cinera 3+    47. Epicoccum Nigrum Negative    48. Phoma Betae Negative    49. Dust Mite Mix 2+    50. Cat Hair 10,000 BAU/ml Negative    51.  Dog Epithelia Negative    52. Mixed Feathers Negative    53. Horse Epithelia Negative    54. Cockroach, German Negative    55. Tobacco Leaf Negative             13 Food Perc - 07/19/23 1101       Test Information   Time Antigen Placed 1030    Allergen Manufacturer Greer    Location Back    Number of allergen test 13      Food   1. Peanut Negative    2. Soybean Negative    3. Wheat Negative    4. Sesame Negative    5. Milk, Cow Negative    6. Casein Negative    7. Egg White, Chicken Negative    8. Shellfish Mix Negative    9. Fish Mix Negative    10. Cashew Negative    11. Walnut Food Negative    12. Almond Negative    13. Hazelnut Negative             Reducing Pollen Exposure  The American Academy of Allergy, Asthma and Immunology suggests the following steps to reduce your exposure to pollen during allergy seasons.    Do not hang sheets or clothing out to dry; pollen may collect on these items. Do not mow lawns or spend time around freshly cut grass; mowing stirs up pollen. Keep windows closed at night.  Keep car windows closed while driving. Minimize morning activities outdoors, a time when pollen counts are usually at their highest. Stay indoors as much as possible when pollen counts or humidity is high and on windy days when pollen tends to remain in the air longer. Use air conditioning when possible.  Many air conditioners have filters that trap the pollen spores. Use a HEPA room air filter to remove pollen form the indoor air you breathe.  Control of  Mold Allergen   Mold and fungi can grow on a variety of surfaces provided certain temperature and moisture conditions exist.  Outdoor molds grow on plants, decaying vegetation and soil.  The major outdoor mold,  Alternaria and Cladosporium, are found in very high numbers during hot and dry conditions.  Generally, a late Summer - Fall peak is seen for common outdoor fungal spores.  Rain will temporarily lower outdoor mold spore count, but counts rise rapidly when the rainy period ends.  The most important indoor molds are Aspergillus and Penicillium.  Dark, humid and poorly ventilated basements are ideal sites for mold growth.  The next most common sites of mold growth are the bathroom and the kitchen.   Indoor (Perennial) Mold Control   Positive indoor molds via skin testing: Fusarium, Rhizopus, and Botrytis  Maintain humidity below 50%. Clean washable surfaces with 5% bleach solution. Remove sources e.g. contaminated carpets.    Control of Dust Mite Allergen    Dust mites play a major role in allergic asthma and rhinitis.  They occur in environments with high humidity wherever human skin is found.  Dust mites absorb humidity from the atmosphere (ie, they do not drink) and feed on organic matter (including shed human and animal skin).  Dust mites are a microscopic type of insect that you cannot see with the naked eye.  High levels of dust mites have been detected from mattresses, pillows, carpets, upholstered furniture, bed covers, clothes, soft toys and any woven material.  The principal allergen of the dust mite is found in its feces.  A gram of dust may contain 1,000 mites and 250,000 fecal particles.  Mite antigen is easily measured in the air during house cleaning activities.  Dust mites do not bite and do not cause harm to humans, other than by triggering allergies/asthma.    Ways to decrease your exposure to dust mites in your home:  Encase mattresses, box springs and pillows with a  mite-impermeable barrier or cover   Wash sheets, blankets and drapes weekly in hot water (130 F) with detergent and dry them in a dryer on the hot setting.  Have the room cleaned frequently with a vacuum cleaner and a damp dust-mop.  For carpeting or rugs, vacuuming with a vacuum cleaner equipped with a high-efficiency particulate air (HEPA) filter.  The dust mite allergic individual should not be in a room which is being cleaned and should wait 1 hour after cleaning before going into the room. Do not sleep on upholstered furniture (eg, couches).   If possible removing carpeting, upholstered furniture and drapery from the home is ideal.  Horizontal blinds should be eliminated in the rooms where the person spends the most time (bedroom, study, television room).  Washable vinyl, roller-type shades are optimal. Remove all non-washable stuffed toys from the bedroom.  Wash stuffed toys weekly like sheets and blankets above.   Reduce indoor humidity to less than 50%.  Inexpensive humidity monitors can be purchased at most hardware stores.  Do not use a humidifier as can make the problem worse and are not recommended.

## 2023-07-19 NOTE — Addendum Note (Signed)
 Addended by: Norville Beery, Ashlyne Olenick on: 07/19/2023 05:41 PM   Modules accepted: Orders

## 2023-07-19 NOTE — Progress Notes (Signed)
 FOLLOW UP  Date of Service/Encounter:  07/19/23   Assessment:   Allergic contact dermatitis - needs patch testing   Perennial and seasonal allergic rhinitis (weeds, trees, indoor molds, and dust mites)  Plan/Recommendations:   1. Allergic contact dermatitis - needs patch testing - Continue with Opzelura twice daily as needed for the rash around your eyes. - This is NOT a steroid and does not sauce the pigmentation problems. - We may consider patch testing is the skin testing is not useful. - This looks for sensitizations to chemicals, cosmetics, etc.  - These are placed on a Monday and then read on a Wednesday and a Friday.   2. Chronic rhinitis - Testing today showed: weeds, trees, indoor molds, and dust mites - Copy of test results provided.  - Avoidance measures provided. - Stop taking: Claritin - Start taking: Xyzal (levocetirizine) 5mg  tablet once daily - You can use an extra dose of the antihistamine, if needed, for breakthrough symptoms.  - Consider nasal saline rinses 1-2 times daily to remove allergens from the nasal cavities as well as help with mucous clearance (this is especially helpful to do before the nasal sprays are given) - We could allergy shots as a means of long-term control. - Allergy shots "re-train" and "reset" the immune system to ignore environmental allergens and decrease the resulting immune response to those allergens (sneezing, itchy watery eyes, runny nose, nasal congestion, etc).    - Allergy shots improve symptoms in 75-85% of patients.  - But I do not think that your symptoms warrant that at this point.   3. Return in about 4 weeks (around 08/16/2023) for [facility] TESTING (NAC-80). You can have the follow up appointment with Dr. Idolina Maker or a Nurse Practicioner (our Nurse Practitioners are excellent and always have Physician oversight!).    Subjective:   Tonya Mclean is a 28 y.o. female presenting today for follow up of No chief complaint on  file.   Tonya Mclean has a history of the following: Patient Active Problem List   Diagnosis Date Noted   Normal labor 09/01/2022   Preeclampsia 11/10/2019    History obtained from: chart review and patient.  Discussed the use of AI scribe software for clinical note transcription with the patient and/or guardian, who gave verbal consent to proceed.  [name] is a 28 y.o. female presenting for skin testing. She was last seen on May 15th. We could not do testing because her insurance company does not cover testing on the same day as a New Patient visit. She has been off of all antihistamines 3 days in anticipation of the testing.   At that visit, we added Opzelura twice daily as needed for the rash around the eyes.  We also talked about doing patch testing.  For her rhinitis, we decided to do environmental allergy testing.  She was on Claritin which was somewhat effective.   Otherwise, there have been no changes to her past medical history, surgical history, family history, or social history.    Review of systems otherwise negative other than that mentioned in the HPI.    Objective:   unknown if currently breastfeeding. There is no height or weight on file to calculate BMI.    Physical exam deferred since this was a skin testing appointment only.   Diagnostic studies:   Allergy Studies:     Airborne Adult Perc - 07/19/23 1101     Time Antigen Placed 1030    Allergen Manufacturer Floyd Hutchinson  Location Back    Number of Test 55    1. Control-Buffer 50% Glycerol Negative    2. Control-Histamine 2+    3. Bahia Negative    4. French Southern Territories Negative    5. Johnson Negative    6. [place]  Blue Negative    7. Meadow Fescue Negative    8. Perennial Rye Negative    9. Timothy Negative    10. Ragweed Mix Negative    11. Cocklebur Negative    12. Plantain,  English 2+    13. Baccharis Negative    14. Dog Fennel Negative    15. Russian Thistle Negative    16. Lamb's Quarters  Negative    17. Sheep Sorrell Negative    18. Rough Pigweed Negative    19. Marsh Elder, Rough Negative    20. Mugwort, Common 2+    21. Box, Elder Negative    22. Cedar, red Negative    23. Sweet Gum 3+    24. Pecan Pollen 2+    25. Pine Mix 2+    26. Walnut, Black Pollen Negative    27. Red Mulberry Negative    28. Ash Mix 2+    29. Birch Mix 2+    30. Beech American 2+    31. Cottonwood, Guinea-Bissau Negative    32. Hickory, White Negative    33. Maple Mix Negative    34. Oak, [place] Mix 4+    35. Sycamore Eastern 2+    36. Alternaria Alternata Negative    37. Cladosporium Herbarum Negative    38. Aspergillus Mix Negative    39. Penicillium Mix Negative    40. Bipolaris Sorokiniana (Helminthosporium) Negative    41. Drechslera Spicifera (Curvularia) Negative    42. Mucor Plumbeus Negative    43. Fusarium Moniliforme 3+    44. Aureobasidium Pullulans (pullulara) Negative    45. Rhizopus Oryzae 3+    46. Botrytis Cinera 3+    47. Epicoccum Nigrum Negative    48. Phoma Betae Negative    49. Dust Mite Mix 2+    50. Cat Hair 10,000 BAU/ml Negative    51.  Dog Epithelia Negative    52. Mixed Feathers Negative    53. Horse Epithelia Negative    54. Cockroach, German Negative    55. Tobacco Leaf Negative             13 Food Perc - 07/19/23 1101       Test Information   Time Antigen Placed 1030    Allergen Manufacturer Greer    Location Back    Number of allergen test 13      Food   1. Peanut Negative    2. Soybean Negative    3. Wheat Negative    4. Sesame Negative    5. Milk, Cow Negative    6. Casein Negative    7. Egg White, Chicken Negative    8. Shellfish Mix Negative    9. Fish Mix Negative    10. Cashew Negative    11. Walnut Food Negative    12. Almond Negative    13. Hazelnut Negative             Allergy  testing results were read and interpreted by myself, documented by clinical staff.      Drexel Gentles, MD  [facility] 

## 2023-08-13 ENCOUNTER — Telehealth: Payer: Self-pay | Admitting: Allergy & Immunology

## 2023-08-13 ENCOUNTER — Ambulatory Visit: Admitting: Dermatology

## 2023-08-13 NOTE — Telephone Encounter (Signed)
 Called Rachyl to schedule NAC 80 and Lakishia is refusing.  She states she doesn't want to have it done.
